# Patient Record
Sex: Female | Born: 1970 | Race: White | Hispanic: No | Smoking: Current every day smoker
Health system: Southern US, Community
[De-identification: ages and names within clinical notes are randomized; demographics above are authoritative.]

## PROBLEM LIST (undated history)

## (undated) DIAGNOSIS — I10 Essential (primary) hypertension: Secondary | ICD-10-CM

## (undated) DIAGNOSIS — I82409 Acute embolism and thrombosis of unspecified deep veins of unspecified lower extremity: Secondary | ICD-10-CM

## (undated) DIAGNOSIS — I2699 Other pulmonary embolism without acute cor pulmonale: Secondary | ICD-10-CM

## (undated) DIAGNOSIS — E785 Hyperlipidemia, unspecified: Secondary | ICD-10-CM

## (undated) HISTORY — DX: Acute embolism and thrombosis of unspecified deep veins of unspecified lower extremity: I82.409

## (undated) HISTORY — DX: Essential (primary) hypertension: I10

## (undated) HISTORY — DX: Hyperlipidemia, unspecified: E78.5

## (undated) HISTORY — PX: BUNIONECTOMY: SHX129

## (undated) HISTORY — DX: Other pulmonary embolism without acute cor pulmonale: I26.99

---

## 1996-09-01 DIAGNOSIS — I82409 Acute embolism and thrombosis of unspecified deep veins of unspecified lower extremity: Secondary | ICD-10-CM

## 1996-09-01 DIAGNOSIS — I2699 Other pulmonary embolism without acute cor pulmonale: Secondary | ICD-10-CM

## 1996-09-01 HISTORY — DX: Other pulmonary embolism without acute cor pulmonale: I26.99

## 1996-09-01 HISTORY — DX: Acute embolism and thrombosis of unspecified deep veins of unspecified lower extremity: I82.409

## 2013-02-05 DIAGNOSIS — M659 Synovitis and tenosynovitis, unspecified: Secondary | ICD-10-CM | POA: Insufficient documentation

## 2013-02-05 DIAGNOSIS — M545 Low back pain, unspecified: Secondary | ICD-10-CM | POA: Insufficient documentation

## 2013-02-05 DIAGNOSIS — K219 Gastro-esophageal reflux disease without esophagitis: Secondary | ICD-10-CM | POA: Insufficient documentation

## 2013-02-05 DIAGNOSIS — R29898 Other symptoms and signs involving the musculoskeletal system: Secondary | ICD-10-CM | POA: Insufficient documentation

## 2013-02-05 DIAGNOSIS — F5105 Insomnia due to other mental disorder: Secondary | ICD-10-CM | POA: Insufficient documentation

## 2013-02-05 DIAGNOSIS — M542 Cervicalgia: Secondary | ICD-10-CM | POA: Insufficient documentation

## 2013-05-13 DIAGNOSIS — R32 Unspecified urinary incontinence: Secondary | ICD-10-CM | POA: Insufficient documentation

## 2014-12-06 ENCOUNTER — Ambulatory Visit (HOSPITAL_COMMUNITY): Payer: 59 | Attending: Cardiology | Admitting: Cardiology

## 2014-12-06 ENCOUNTER — Other Ambulatory Visit (HOSPITAL_COMMUNITY): Payer: Self-pay | Admitting: *Deleted

## 2014-12-06 DIAGNOSIS — M79605 Pain in left leg: Secondary | ICD-10-CM | POA: Diagnosis not present

## 2014-12-06 DIAGNOSIS — M7989 Other specified soft tissue disorders: Secondary | ICD-10-CM | POA: Insufficient documentation

## 2014-12-06 NOTE — Progress Notes (Signed)
Left lower venous duplex performed  

## 2016-03-12 ENCOUNTER — Other Ambulatory Visit (HOSPITAL_COMMUNITY): Payer: Self-pay | Admitting: General Surgery

## 2016-03-26 ENCOUNTER — Ambulatory Visit (HOSPITAL_COMMUNITY): Payer: 59

## 2016-03-26 ENCOUNTER — Ambulatory Visit (HOSPITAL_COMMUNITY): Admission: RE | Admit: 2016-03-26 | Payer: 59 | Source: Ambulatory Visit

## 2016-04-01 ENCOUNTER — Ambulatory Visit: Payer: 59 | Admitting: Skilled Nursing Facility1

## 2017-01-09 ENCOUNTER — Ambulatory Visit (INDEPENDENT_AMBULATORY_CARE_PROVIDER_SITE_OTHER): Payer: 59 | Admitting: Podiatry

## 2017-01-09 VITALS — BP 143/94 | HR 104

## 2017-01-09 DIAGNOSIS — L600 Ingrowing nail: Secondary | ICD-10-CM

## 2017-01-09 NOTE — Progress Notes (Signed)
   Subjective:    Patient ID: Rita Cunningham, female    DOB: 10-Jun-1971, 46 y.o.   MRN: [id number]  HPI    Review of Systems  Endocrine:       Increased urination  Hematological: Bruises/bleeds easily.       Objective:   Physical Exam        Assessment & Plan:

## 2017-01-11 NOTE — Progress Notes (Signed)
Subjective:    Patient ID: Rita LeitzKelly Demont, female   DOB: 46 y.o.   MRN: [id number]   HPI patient presents with chronic damage and pain to the big toenails of both feet that have been very painful for a long time and she's tried to trim them tried to soak them tried medication without relief of symptoms and it's been going on for years    Review of Systems  All other systems reviewed and are negative.       Objective:  Physical Exam  Cardiovascular: Intact distal pulses.   Musculoskeletal: Normal range of motion.  Neurological: She is alert.  Skin: Skin is warm and dry.  Nursing note and vitals reviewed.  neurovascular status found to be intact muscle strength adequate range of motion within normal limits with patient found to have very thickened incurvated painful hallux nails of both feet that have multiple signs of long-term damage and trauma     Assessment:    Chronic nail disease hallux bilateral that are very painful when pressed with long-term disability     Plan:   H&P with condition reviewed. I do think removal of the nails as indicated and patient wants this done and at this point I explained procedure and risk. Patient is scheduled to have this done in the next several weeks

## 2017-01-23 ENCOUNTER — Ambulatory Visit (INDEPENDENT_AMBULATORY_CARE_PROVIDER_SITE_OTHER): Payer: 59 | Admitting: Podiatry

## 2017-01-23 DIAGNOSIS — L600 Ingrowing nail: Secondary | ICD-10-CM | POA: Diagnosis not present

## 2017-01-23 MED ORDER — OXYCODONE-ACETAMINOPHEN 10-325 MG PO TABS: 1.0000 | ORAL_TABLET | Freq: Three times a day (TID) | ORAL | 0 refills | Status: DC | PRN

## 2017-01-23 NOTE — Patient Instructions (Addendum)

## 2017-01-24 NOTE — Progress Notes (Signed)
Subjective:    Patient ID: Rita Cunningham, female   DOB: 46 y.o.   MRN: [id number]   HPI patient presents with severe chronic nail disease hallux bilateral that she's wanted to remove and hit scheduled for several weeks ago. States they're very sore    ROS      Objective:  Physical Exam Neurovascular status intact muscle strength adequate with severe incurvation of nail bed hallux bilateral that are very painful when pressed and makes wearing shoe gear difficult and they have been there for years    Assessment:    Chronic nail disease hallux bilateral with pain     Plan:    H&P condition reviewed and recommended nail removal explaining procedure risk and recovery. She wants the surgery and today I infiltrated each hallux 60 mg like Marcaine mixture remove the nails exposed matrix and applied phenol 5 applications 30 seconds followed by alcohol lavaged sterile dressing. Wrote her a prescription for Percocet just to take at night and advised on elevation soaks and will be seen back

## 2017-06-26 DIAGNOSIS — F325 Major depressive disorder, single episode, in full remission: Secondary | ICD-10-CM | POA: Insufficient documentation

## 2017-09-09 ENCOUNTER — Ambulatory Visit: Payer: 59 | Admitting: Physician Assistant

## 2017-09-18 ENCOUNTER — Ambulatory Visit: Payer: BLUE CROSS/BLUE SHIELD | Admitting: Physician Assistant

## 2017-09-18 ENCOUNTER — Encounter: Payer: Self-pay | Admitting: Physician Assistant

## 2017-09-18 VITALS — BP 142/82 | HR 88 | Ht 66.0 in | Wt 190.0 lb

## 2017-09-18 DIAGNOSIS — R41840 Attention and concentration deficit: Secondary | ICD-10-CM | POA: Diagnosis not present

## 2017-09-18 DIAGNOSIS — N3281 Overactive bladder: Secondary | ICD-10-CM | POA: Diagnosis not present

## 2017-09-18 DIAGNOSIS — I2699 Other pulmonary embolism without acute cor pulmonale: Secondary | ICD-10-CM | POA: Diagnosis not present

## 2017-09-18 DIAGNOSIS — F411 Generalized anxiety disorder: Secondary | ICD-10-CM | POA: Diagnosis not present

## 2017-09-18 DIAGNOSIS — I10 Essential (primary) hypertension: Secondary | ICD-10-CM | POA: Diagnosis not present

## 2017-09-18 DIAGNOSIS — F39 Unspecified mood [affective] disorder: Secondary | ICD-10-CM | POA: Diagnosis not present

## 2017-09-18 DIAGNOSIS — E782 Mixed hyperlipidemia: Secondary | ICD-10-CM | POA: Diagnosis not present

## 2017-09-18 DIAGNOSIS — H9191 Unspecified hearing loss, right ear: Secondary | ICD-10-CM | POA: Diagnosis not present

## 2017-09-18 NOTE — Patient Instructions (Signed)
Will make referral for urology and ADHD testing.

## 2017-09-18 NOTE — Progress Notes (Signed)
   Subjective:    Patient ID: Rita Cunningham, female    DOB: Apr 08, 1971, 47 y.o.   MRN: [id number]  HPI Pt is a 47 yo female with episodic mood disordere, HTN, OAB who presents to the clinic to establish care.   OAB-not controlled with mybetriq or vesicare. She feels like she needs more of a work up.   Her mood disorder and anxiety is managed by Dr. Sandria ManlyLove.   She is concerned today because of her focus. It has become increasingly hard for the last 4 years. She finds it the hardest at work. 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 She finds herself making mistakes and increasing her anxiety.   .. Active Ambulatory Problems    Diagnosis Date Noted  . Hyperlipidemia 09/20/2017  . Pulmonary embolism (HCC) 09/01/1996  . Hypertension 09/20/2017  . Episodic mood disorder (HCC) 09/20/2017  . GAD (generalized anxiety disorder) 09/20/2017  . Hearing loss of right ear 09/20/2017  . OAB (overactive bladder) 09/20/2017  . Inattention 09/20/2017   Resolved Ambulatory Problems    Diagnosis Date Noted  . No Resolved Ambulatory Problems   Past Medical History:  Diagnosis Date  . DVT (deep venous thrombosis) (HCC) 1998  . Hyperlipidemia   . Hypertension   . Pulmonary embolism (HCC) 1998     Review of Systems  All other systems reviewed and are negative.      Objective:   Physical Exam  Constitutional: She is oriented to person, place, and time. She appears well-developed and well-nourished.  HENT:  Head: Normocephalic and atraumatic.  Cardiovascular: Normal rate, regular rhythm and normal heart sounds.  Pulmonary/Chest: Effort normal and breath sounds normal.  Neurological: She is alert and oriented to person, place, and time.  Psychiatric: She has a normal mood and affect. Her behavior is normal.          Assessment & Plan:  Marland Kitchen.Marland Kitchen.Tresa EndoKelly was seen today  for establish care and urinary incontinence.  Diagnoses and all orders for this visit:  Inattention -     Ambulatory referral to Psychology  OAB (overactive bladder) -     Ambulatory referral to Urology  Mixed hyperlipidemia  Other pulmonary embolism without acute cor pulmonale, unspecified chronicity (HCC)  Essential hypertension  Episodic mood disorder (HCC)  GAD (generalized anxiety disorder)  Hearing loss of right ear, unspecified hearing loss type   .Marland Kitchen. [facility]  Decreased Interest 0  Down, Depressed, Hopeless 0  PHQ - 2 Score 0  Altered sleeping 0  Tired, decreased energy 1  Change in appetite 0  Feeling bad or failure about yourself  0  Trouble concentrating 1  Moving slowly or fidgety/restless 1  Suicidal thoughts 0  PHQ-9 Score 3   Per patient had fasting labs recently. Will get us a copy to scan in.   I would like patient to be evaluated for ADHD. Certainly some of her mood disorders could be making focus difficult or the fact that her job conditions make it hard to focus. Her office is working on getting her a door with her hearing loss.   OAB pt has tried mybetriq and vesicare with no symptom relief. Referral made.

## 2017-09-20 ENCOUNTER — Encounter: Payer: Self-pay | Admitting: Physician Assistant

## 2017-09-20 DIAGNOSIS — I10 Essential (primary) hypertension: Secondary | ICD-10-CM | POA: Insufficient documentation

## 2017-09-20 DIAGNOSIS — F411 Generalized anxiety disorder: Secondary | ICD-10-CM | POA: Insufficient documentation

## 2017-09-20 DIAGNOSIS — R41840 Attention and concentration deficit: Secondary | ICD-10-CM | POA: Insufficient documentation

## 2017-09-20 DIAGNOSIS — N3281 Overactive bladder: Secondary | ICD-10-CM | POA: Insufficient documentation

## 2017-09-20 DIAGNOSIS — H9191 Unspecified hearing loss, right ear: Secondary | ICD-10-CM | POA: Insufficient documentation

## 2017-09-20 DIAGNOSIS — E785 Hyperlipidemia, unspecified: Secondary | ICD-10-CM | POA: Insufficient documentation

## 2017-09-20 DIAGNOSIS — F39 Unspecified mood [affective] disorder: Secondary | ICD-10-CM | POA: Insufficient documentation

## 2017-09-25 ENCOUNTER — Encounter: Payer: Self-pay | Admitting: Physician Assistant

## 2018-07-20 DIAGNOSIS — Z0000 Encounter for general adult medical examination without abnormal findings: Secondary | ICD-10-CM | POA: Insufficient documentation

## 2019-01-27 ENCOUNTER — Ambulatory Visit: Payer: PRIVATE HEALTH INSURANCE | Admitting: Podiatry

## 2019-01-27 ENCOUNTER — Other Ambulatory Visit: Payer: Self-pay

## 2019-01-27 ENCOUNTER — Encounter: Payer: Self-pay | Admitting: Podiatry

## 2019-01-27 VITALS — Temp 98.2°F

## 2019-01-27 DIAGNOSIS — L600 Ingrowing nail: Secondary | ICD-10-CM

## 2019-01-27 MED ORDER — OXYCODONE-ACETAMINOPHEN 10-325 MG PO TABS: 1.0000 | ORAL_TABLET | Freq: Three times a day (TID) | ORAL | 0 refills | Status: AC | PRN

## 2019-01-27 MED ORDER — NEOMYCIN-POLYMYXIN-HC 3.5-10000-1 OT SOLN: OTIC | 0 refills | Status: DC

## 2019-01-27 NOTE — Progress Notes (Signed)
Subjective:   Patient ID: Rita Cunningham, female   DOB: 48 y.o.   MRN: [id number]   HPI Patient states there is been a regrowth of nail bed on the right big toe and it hurts somewhat on the lateral side and it makes it hard to wear shoe gear comfortably   ROS      Objective:  Physical Exam  Neurovascular status intact with patient found to have a dystrophic hallux nail right lateral side that is moderately painful when palpated     Assessment:  Chronic hallux nail disease right hallux with pain     Plan:  H&P condition reviewed and recommended removal of the corner.  Explained procedure risk and patient wants surgery and today I went ahead and infiltrated the right hallux 60 mg like Marcaine mixture removed the pathological nail exposed fat and applied phenol 3 applications 30 seconds followed by alcohol lavage and sterile dressing.  Gave instructions on soaks and reappoint

## 2019-01-27 NOTE — Patient Instructions (Signed)

## 2019-01-28 ENCOUNTER — Ambulatory Visit: Payer: 59 | Admitting: Podiatry

## 2020-01-11 ENCOUNTER — Other Ambulatory Visit: Payer: Self-pay | Admitting: *Deleted

## 2020-01-11 DIAGNOSIS — I83893 Varicose veins of bilateral lower extremities with other complications: Secondary | ICD-10-CM

## 2020-01-16 ENCOUNTER — Other Ambulatory Visit: Payer: Self-pay

## 2020-01-16 ENCOUNTER — Ambulatory Visit (HOSPITAL_COMMUNITY)
Admission: RE | Admit: 2020-01-16 | Discharge: 2020-01-16 | Disposition: A | Discharge status: Home / Self Care | Payer: PRIVATE HEALTH INSURANCE | Source: Ambulatory Visit | Attending: Surgery | Admitting: Surgery

## 2020-01-16 DIAGNOSIS — I83893 Varicose veins of bilateral lower extremities with other complications: Secondary | ICD-10-CM

## 2020-01-18 ENCOUNTER — Ambulatory Visit: Payer: PRIVATE HEALTH INSURANCE | Admitting: Vascular Surgery

## 2020-01-18 ENCOUNTER — Encounter: Payer: Self-pay | Admitting: Vascular Surgery

## 2020-01-18 ENCOUNTER — Other Ambulatory Visit: Payer: Self-pay

## 2020-01-18 VITALS — BP 119/60 | HR 114 | Temp 98.0°F | Resp 18 | Ht 66.0 in | Wt 206.9 lb

## 2020-01-18 DIAGNOSIS — I83813 Varicose veins of bilateral lower extremities with pain: Secondary | ICD-10-CM

## 2020-01-18 NOTE — Progress Notes (Signed)
Patient name: Rita Cunningham MRN: [id number] DOB: 12/13/1970 Sex: female  HPI: [name] is a 49 y.o. female, presents with symptomatic varicose veins with pain and swelling.  She has a very large varicosity down the lateral aspect of her right leg which becomes inflamed red and swollen at times.  She has had a prior DVT and pulmonary embolus in the remote past.  She has no family history of varicose veins.  She also has some varicosities on the inner aspect of both ankles.  The left leg occasionally hurts but it is not as bad as the right.  She states she has worn compression stockings for the last 10 years but they have never really given her relief.  She has not had any prior wounds on her legs.  Other medical problems include hyperlipidemia hypertension.  She does currently smoke.  Past Medical History:  Diagnosis Date  . DVT (deep venous thrombosis) ([place]) 1998  . Hyperlipidemia   . Hypertension   . Pulmonary embolism ([place]) 1998   Past Surgical History:  Procedure Laterality Date  . BUNIONECTOMY    . CESAREAN SECTION      Family History  Problem Relation Age of Onset  . Stroke Mother   . Stroke Father   . Heart attack Maternal Grandmother   . Heart attack Maternal Grandfather   . Birth defects Paternal Grandmother     SOCIAL HISTORY: Social History   Socioeconomic History  . Marital status: Legally Separated    Spouse name: Not on file  . Number of children: Not on file  . Years of education: Not on file  . Highest education level: Not on file  Occupational History  . Not on file  Tobacco Use  . Smoking status: Current Every Day Smoker  . Smokeless tobacco: Never Used  Substance and Sexual Activity  . Alcohol use: Yes  . Drug use: No  . Sexual activity: Not Currently  Other Topics Concern  . Not on file  Social History Narrative  . Not on file   Social Determinants of Health   Financial Resource Strain:   . Difficulty of Paying Living Expenses:   Food  Insecurity:   . Worried About Charity fundraiser in the Last Year:   . Arboriculturist in the Last Year:   Transportation Needs:   . Film/video editor (Medical):   Marland Kitchen Lack of Transportation (Non-Medical):   Physical Activity:   . Days of Exercise per Week:   . Minutes of Exercise per Session:   Stress:   . Feeling of Stress :   Social Connections:   . Frequency of Communication with Friends and Family:   . Frequency of Social Gatherings with Friends and Family:   . Attends Religious Services:   . Active Member of Clubs or Organizations:   . Attends Archivist Meetings:   Marland Kitchen Marital Status:   Intimate Partner Violence:   . Fear of Current or Ex-Partner:   . Emotionally Abused:   Marland Kitchen Physically Abused:   . Sexually Abused:     Allergies  Allergen Reactions  . Morphine Other (See Comments)    Current Outpatient Medications  Medication Sig Dispense Refill  . aspirin 325 MG tablet Take 325 mg by mouth daily.    . clonazePAM (KLONOPIN) 1 MG tablet Take 0.5 tablets by mouth at bedtime.    Marland Kitchen escitalopram (LEXAPRO) 20 MG tablet Take by mouth.    . Ibuprofen-Famotidine (DUEXIS)  800-26.6 MG TABS Take by mouth.    . lamoTRIgine (LAMICTAL) 200 MG tablet Take 1 to 2 tabs a day    . LOSARTAN POTASSIUM PO Take 25 mg by mouth daily.     . Melatonin 3 MG TABS Take 6 mg by mouth.    . neomycin-polymyxin-hydrocortisone (CORTISPORIN) OTIC solution Apply 1-2 drops to toe after soaking twice a day 10 mL 0  . Olopatadine HCl 0.2 % SOLN APPLY 1 DROP INTO BOTH EYES ONCE A DAY AS NEEDED    . simvastatin (ZOCOR) 20 MG tablet Take by mouth.    . TURMERIC PO Take by mouth.     No current facility-administered medications for this visit.    ROS:   General:  No weight loss, Fever, chills  HEENT: No recent headaches, no nasal bleeding, no visual changes, no sore throat  Neurologic: No dizziness, blackouts, seizures. No recent symptoms of stroke or mini- stroke. No recent episodes of  slurred speech, or temporary blindness.  Cardiac: No recent episodes of chest pain/pressure, no shortness of breath at rest.  No shortness of breath with exertion.  Denies history of atrial fibrillation or irregular heartbeat  Vascular: No history of rest pain in feet.  No history of claudication.  No history of non-healing ulcer, No history of DVT   Pulmonary: No home oxygen, no productive cough, no hemoptysis,  No asthma or wheezing  Musculoskeletal:  [ ]  Arthritis, [ ]  Low back pain,  [ ]  Joint pain  Hematologic:No history of hypercoagulable state.  No history of easy bleeding.  No history of anemia  Gastrointestinal: No hematochezia or melena,  No gastroesophageal reflux, no trouble swallowing  Urinary: [ ]  chronic Kidney disease, [ ]  on HD - [ ]  MWF or [ ]  TTHS, [ ]  Burning with urination, [ ]  Frequent urination, [ ]  Difficulty urinating;   Skin: No rashes  Psychological: No history of anxiety,  No history of depression   Physical Examination  There were no vitals filed for this visit.  There is no height or weight on file to calculate BMI.  General:  Alert and oriented, no acute distress HEENT: Normal Neck: No JVD Cardiac: Regular Rate and Rhythm Skin: No rash, 4 mm tortuous varicosity running down the lateral aspect of her right thigh to the knee.  Smaller 3 mm varicosities in the medial ankle bilaterally. Extremity Pulses:  2+ dorsalis pedis pulses bilaterally Musculoskeletal: No deformity or edema  Neurologic: Upper and lower extremity motor 5/5 and symmetric  DATA:  Patient had a venous reflux exam 2 days ago.  I reviewed and interpreted the study.  Right leg showed deep vein reflux.  She also had reflux in the right greater saphenous vein with a vein diameter of 5 mm throughout most of its course.  She also had a accessory saphenous with vein diameter of 4 to 5 mm with reflux.  The anterior accessory communicated with most of the varicosities on the lateral thigh.  On  the left side she had no deep vein reflux.  She did have reflux in the left greater saphenous vein with a 4 to 5 mm diameter.  Lesser saphenous vein had no reflux.  I did a SonoSite exam at the bedside which confirmed that the right greater saphenous is about 4 mm in diameter.  The left was about 3-1/2 to 4 mm in diameter.  I was unable to demonstrate a clear communication with the anterior accessory vein on the right leg.  ASSESSMENT: Patient with symptomatic varicose veins with pain and swelling.  Right leg worse than the left.   PLAN: Symptomatic varicose veins.  Pathophysiology of veins was discussed with patient today.  We will start out with conservative measures of continued compression stockings thigh-Kurt 20 to 30 mmHg she was measured and fitted for these today.  She will also be leg elevation and continue with her anti-inflammatories that she takes for other indications.  She will follow up with [place] in 3 months time for consideration of laser ablation if her symptoms or not improved and relieved.   Fabienne Bruns, MD [facility] [place] Office: [phone] Pager: [phone]

## 2020-02-02 ENCOUNTER — Encounter: Payer: BLUE CROSS/BLUE SHIELD | Admitting: Vascular Surgery

## 2020-02-02 ENCOUNTER — Encounter (HOSPITAL_COMMUNITY): Payer: BLUE CROSS/BLUE SHIELD

## 2020-02-21 DIAGNOSIS — E559 Vitamin D deficiency, unspecified: Secondary | ICD-10-CM | POA: Insufficient documentation

## 2020-04-25 ENCOUNTER — Ambulatory Visit: Payer: PRIVATE HEALTH INSURANCE | Admitting: Vascular Surgery

## 2020-04-25 ENCOUNTER — Other Ambulatory Visit: Payer: Self-pay

## 2020-04-25 ENCOUNTER — Encounter: Payer: Self-pay | Admitting: Vascular Surgery

## 2020-04-25 VITALS — BP 119/79 | HR 96 | Temp 97.9°F | Resp 14

## 2020-04-25 DIAGNOSIS — I83813 Varicose veins of bilateral lower extremities with pain: Secondary | ICD-10-CM | POA: Diagnosis not present

## 2020-04-25 NOTE — Progress Notes (Signed)
Patient is a 49 year old female who returns for follow-up today.  She was last seen May 2021.  At that time she had primarily right leg varicose veins which were symptomatic with pain and swelling especially over the right lateral thigh.  She also has a cluster of varicosities in her left medial malleolus that sometimes causes some pain and swelling.  She has not really had complete relief of her symptoms from her compression stockings.  Review of systems: She has no shortness of breath.  She has no chest pain.  She did have a prior history of a DVT but is no longer on anticoagulation.  Physical exam:  Vitals:   04/25/20 1408  BP: 119/79  Pulse: 96  Resp: 14  Temp: 97.9 F (36.6 C)  TempSrc: Temporal  SpO2: 96%    Extremities: Right lateral thigh tortuous varicosities covering a surface area of about 12 cm, left medial malleolus cluster of varicosities covering a surface area of about 5 cm veins about 4 to 5 mm in diameter on both sides.  Data: I performed a SonoSite ultrasound at the bedside today which confirmed that the patient's right greater saphenous vein is 5 mm in diameter at the knee and fairly uniform all the way up to the saphenofemoral junction.  Assessment: Symptomatic varicose veins right leg with some mild symptoms in the left leg.  At this point patient wishes to consider laser ablation.  Plan: Right greater saphenous laser ablation with stab avulsions greater than 20 in the near future pending insurance approval.  Fabienne Bruns, MD [facility] [place] Office: [phone]

## 2020-06-05 ENCOUNTER — Other Ambulatory Visit: Payer: Self-pay | Admitting: *Deleted

## 2020-06-05 DIAGNOSIS — I83811 Varicose veins of right lower extremities with pain: Secondary | ICD-10-CM

## 2020-06-19 ENCOUNTER — Other Ambulatory Visit: Payer: Self-pay

## 2020-06-19 DIAGNOSIS — I83811 Varicose veins of right lower extremities with pain: Secondary | ICD-10-CM

## 2020-06-27 ENCOUNTER — Encounter: Payer: Self-pay | Admitting: Vascular Surgery

## 2020-06-27 ENCOUNTER — Ambulatory Visit: Payer: PRIVATE HEALTH INSURANCE | Admitting: Vascular Surgery

## 2020-06-27 ENCOUNTER — Other Ambulatory Visit: Payer: Self-pay

## 2020-06-27 VITALS — BP 144/97 | HR 90 | Temp 98.2°F | Resp 16 | Ht 66.0 in | Wt 210.0 lb

## 2020-06-27 DIAGNOSIS — I83811 Varicose veins of right lower extremities with pain: Secondary | ICD-10-CM | POA: Diagnosis not present

## 2020-06-27 HISTORY — PX: OTHER SURGICAL HISTORY: SHX169

## 2020-06-27 NOTE — Progress Notes (Signed)
     Laser Ablation Procedure    Date: 06/27/2020   Rita Cunningham DOB:05-05-1971  Consent signed: Yes      Surgeon: Fabienne Bruns  MD  Procedure: Laser Ablation: right Greater Saphenous Vein  BP (!) 144/97 (BP Location: Right Arm, Patient Position: Sitting, Cuff Size: Large)   Pulse 90   Temp 98.2 F (36.8 C) (Temporal)   Resp 16   Ht 5\' 6"  (1.676 m)   Wt 210 lb (95.3 kg)   SpO2 98%   BMI 33.89 kg/m   Tumescent Anesthesia: 600 cc 0.9% NaCl with 50 cc Lidocaine HCL 1%  and 15 cc 8.4% NaHCO3  Local Anesthesia: 17 cc Lidocaine HCL and NaHCO3 (ratio 2:1)  7 watts continuous mode     Total energy: 1688 JOULES    Total time: 241 SEC Treatment Length 28 CM  Laser Fiber Ref. #                              Lot #  [id number]   Stab Phlebectomy: > 20 Sites: Thigh and Calf  Patient tolerated procedure well  Notes: Patient wore face mask.  All staff members wore facial masks and facial shields/goggles.    Description of Procedure:  After marking the course of the secondary varicosities, the patient was placed on the operating table in the supine position, and the right leg was prepped and draped in sterile fashion.   Local anesthetic was administered and under ultrasound guidance the saphenous vein was accessed with a micro needle and guide wire; then the mirco puncture sheath was placed.  A guide wire was inserted saphenofemoral junction , followed by a 5 french sheath.  The position of the sheath and then the laser fiber below the junction was confirmed using the ultrasound.  Tumescent anesthesia was administered along the course of the saphenous vein using ultrasound guidance. The patient was placed in Trendelenburg position and protective laser glasses were placed on patient and staff, and the laser was fired at 7 watts continuous mode for a total of 1688 joules.   For stab phlebectomies, local anesthetic was administered at the previously marked varicosities, and tumescent  anesthesia was administered around the vessels.  Greater than 20 stab wounds were made using the tip of an 11 blade. And using the vein hook, the phlebectomies were performed using a hemostat to avulse the varicosities.  Adequate hemostasis was achieved.     Steri strips were applied to the stab wounds and ABD pads and thigh Sparkman compression stockings were applied.  Ace wrap bandages were applied over the phlebectomy sites and at the top of the saphenofemoral junction. Blood loss was less than 15 cc.  Discharge instructions reviewed with patient and hardcopy of discharge instructions given to patient to take home. The patient ambulated out of the operating room having tolerated the procedure well.  [id number], MD [facility] [place] Office: [phone]

## 2020-07-04 ENCOUNTER — Other Ambulatory Visit: Payer: Self-pay

## 2020-07-04 ENCOUNTER — Ambulatory Visit (HOSPITAL_COMMUNITY)
Admission: RE | Admit: 2020-07-04 | Discharge: 2020-07-04 | Disposition: A | Discharge status: Home / Self Care | Payer: PRIVATE HEALTH INSURANCE | Source: Ambulatory Visit | Attending: Vascular Surgery | Admitting: Vascular Surgery

## 2020-07-04 ENCOUNTER — Encounter: Payer: Self-pay | Admitting: Vascular Surgery

## 2020-07-04 ENCOUNTER — Ambulatory Visit (INDEPENDENT_AMBULATORY_CARE_PROVIDER_SITE_OTHER): Payer: PRIVATE HEALTH INSURANCE | Admitting: Vascular Surgery

## 2020-07-04 VITALS — BP 124/81 | HR 104 | Temp 98.1°F | Resp 16 | Ht 60.6 in

## 2020-07-04 DIAGNOSIS — I83811 Varicose veins of right lower extremities with pain: Secondary | ICD-10-CM

## 2020-07-04 NOTE — Progress Notes (Signed)
Patient is a 49 year old female who returns for postoperative follow-up today.  Patient underwent right greater saphenous vein laser ablation greater than 20 stabs on June 27, 2020.  Physical exam:  Vitals:   07/04/20 1046  BP: 124/81  Pulse: (!) 104  Resp: 16  Temp: 98.1 F (36.7 C)  TempSrc: Temporal  SpO2: 97%  Height: 5' 0.6" (1.539 m)    Extremities: Healing stab sites no erythema no discharge mild ecchymosis right inner thigh  Data: Patient had a duplex ultrasound today which shows successful ablation of the right greater saphenous vein within 7 mm of the saphenofemoral junction no DVT.  Assessment: Doing well status post laser ablation stab avulsions right greater saphenous vein and tributaries.  Plan: Patient will continue to wear the compression stockings.  She will follow up on an as-needed basis.  Fabienne Bruns, MD [facility] [place] Office: [phone]

## 2020-07-13 ENCOUNTER — Other Ambulatory Visit: Payer: Self-pay

## 2020-07-13 ENCOUNTER — Ambulatory Visit (HOSPITAL_COMMUNITY)
Admission: RE | Admit: 2020-07-13 | Discharge: 2020-07-13 | Disposition: A | Discharge status: Home / Self Care | Payer: PRIVATE HEALTH INSURANCE | Source: Ambulatory Visit | Attending: Vascular Surgery | Admitting: Vascular Surgery

## 2020-07-13 ENCOUNTER — Other Ambulatory Visit (HOSPITAL_COMMUNITY): Payer: Self-pay | Admitting: Vascular Surgery

## 2020-07-13 DIAGNOSIS — M79604 Pain in right leg: Secondary | ICD-10-CM

## 2020-07-13 DIAGNOSIS — R609 Edema, unspecified: Secondary | ICD-10-CM

## 2020-07-31 ENCOUNTER — Encounter: Payer: Self-pay | Admitting: Vascular Surgery

## 2021-10-11 ENCOUNTER — Encounter: Payer: Self-pay | Admitting: Cardiology

## 2021-10-11 ENCOUNTER — Other Ambulatory Visit: Payer: Self-pay

## 2021-10-11 ENCOUNTER — Ambulatory Visit: Payer: No Typology Code available for payment source | Admitting: Cardiology

## 2021-10-11 VITALS — BP 149/94 | HR 114 | Temp 98.6°F | Resp 17 | Ht 66.0 in | Wt 223.2 lb

## 2021-10-11 DIAGNOSIS — R739 Hyperglycemia, unspecified: Secondary | ICD-10-CM

## 2021-10-11 DIAGNOSIS — R0609 Other forms of dyspnea: Secondary | ICD-10-CM

## 2021-10-11 DIAGNOSIS — I10 Essential (primary) hypertension: Secondary | ICD-10-CM

## 2021-10-11 DIAGNOSIS — Z720 Tobacco use: Secondary | ICD-10-CM

## 2021-10-11 DIAGNOSIS — E7800 Pure hypercholesterolemia, unspecified: Secondary | ICD-10-CM

## 2021-10-11 DIAGNOSIS — R002 Palpitations: Secondary | ICD-10-CM

## 2021-10-11 MED ORDER — VERAPAMIL HCL ER 240 MG PO TBCR: 240.0000 mg | EXTENDED_RELEASE_TABLET | Freq: Every day | ORAL | 2 refills | Status: DC

## 2021-10-11 NOTE — Telephone Encounter (Signed)
From patient.

## 2021-10-11 NOTE — Progress Notes (Addendum)
Primary Physician/Referring:  Magdalene Molly, Inda Merlin, NP  Patient ID: Rita Cunningham, female    DOB: [birth year], 51 y.o.   MRN: [id number]  Chief Complaint  Patient presents with   New Patient (Initial Visit)   Hypertension   Tachycardia   Family history of heart disease   HPI:    Rita Cunningham  is a 51 y.o. Caucasian female patient with strong family history of vascular disease, mother had strokes at age 61, father had coronary disease and CABG at age 22, her brother 34 years older than her has had coronary stents at age 63, hypertension, hyperlipidemia, tobacco use disorder, moderate obesity referred to me for evaluation of hypertension and dyspnea on exertion.  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  She is presently on aspirin 325 mg daily.  Except for chronic mild exertional dyspnea she has no other complaints and feels that her heart rate is up and blood pressure is uncontrolled.  She is concerned about her vascular risk factors and wanted to establish care.  Past Medical History:  Diagnosis Date   DVT (deep venous thrombosis) ([place]) 1998   Hyperlipidemia    Hypertension    Pulmonary embolism ([place]) 1998   Past Surgical History:  Procedure Laterality Date   BUNIONECTOMY     CESAREAN SECTION     ENDOVENOUS LASER ABLATION OF RIGHT GSV AND STAB PHLEBECTOMIES OF RIGHT LEG Right 06/27/2020   Family History  Problem Relation Age of Onset   Stroke Mother 41   COPD Mother 63   Heart disease Father 65   Drug abuse Sister    Heart attack Brother 88   Heart attack Maternal Grandmother    Heart attack Maternal Grandfather    Birth defects Paternal Grandmother     Social History   Tobacco Use   Smoking status: Every Day    Packs/day: 1.00    Years: 30.00    Pack years: 30.00    Types: Cigarettes   Smokeless  tobacco: Never  Substance Use Topics   Alcohol use: Yes    Comment: occ   Marital Status: Divorced  ROS  Review of Systems  Cardiovascular:  Positive for dyspnea on exertion and palpitations. Negative for chest pain and leg swelling.  Objective  Blood pressure (!) 149/94, pulse (!) 114, temperature 98.6 F (37 C), temperature source Temporal, resp. rate 17, height '5\' 6"'  (1.676 m), weight 223 lb 3.2 oz (101.2 kg), SpO2 98 %. Body mass index is 36.03 kg/m.  Vitals with BMI 10/11/2021 07/04/2020 06/27/2020  Height '5\' 6"'  5' 0.6" '5\' 6"'   Weight 223 lbs 3 oz - 210 lbs  BMI 55.73 - 22.02  Systolic 542 706 237  Diastolic 94 81 97  Pulse 628 104 90    Physical Exam Constitutional:      Appearance: She is morbidly obese.  Neck:     Vascular: No carotid bruit or JVD.  Cardiovascular:     Rate and Rhythm: Normal rate and regular rhythm.     Pulses: Intact distal pulses.     Heart sounds: Normal heart sounds. No murmur heard.   No gallop.  Pulmonary:     Effort: Pulmonary effort is normal.     Breath sounds: Normal breath sounds.  Abdominal:     General: Bowel sounds are normal.  Palpations: Abdomen is soft.  Musculoskeletal:     Right lower leg: No edema.     Left lower leg: No edema.     Medications and allergies   Allergies  Allergen Reactions   Morphine Other (See Comments)    Severe cranial pain   Tape Rash     Medication list after today's encounter    Current Outpatient Medications:    aspirin 325 MG tablet, Take 325 mg by mouth daily., Disp: , Rfl:    buPROPion (WELLBUTRIN XL) 300 MG 24 hr tablet, Take 1 tablet by mouth daily., Disp: , Rfl:    clonazePAM (KLONOPIN) 1 MG tablet, Take 0.5 tablets by mouth at bedtime., Disp: , Rfl:    escitalopram (LEXAPRO) 20 MG tablet, Take by mouth., Disp: , Rfl:    Ibuprofen-Famotidine 800-26.6 MG TABS, Take by mouth., Disp: , Rfl:    lamoTRIgine (LAMICTAL) 200 MG tablet, Take 1 to 2 tabs a day, Disp: , Rfl:     losartan-hydrochlorothiazide (HYZAAR) 50-12.5 MG tablet, Take 1 tablet by mouth daily., Disp: , Rfl:    magnesium oxide (MAG-OX) 400 MG tablet, Take 1 tablet by mouth daily., Disp: , Rfl:    Melatonin 3 MG TABS, Take 6 mg by mouth., Disp: , Rfl:    Olopatadine HCl 0.2 % SOLN, APPLY 1 DROP INTO BOTH EYES ONCE A DAY AS NEEDED, Disp: , Rfl:    oxybutynin (DITROPAN) 5 MG tablet, Take 5 mg by mouth daily., Disp: , Rfl:    pantoprazole (PROTONIX) 20 MG tablet, Take 1 tablet by mouth daily., Disp: , Rfl:    simvastatin (ZOCOR) 40 MG tablet, Take 40 mg by mouth at bedtime., Disp: , Rfl:    verapamil (CALAN-SR) 240 MG CR tablet, Take 1 tablet (240 mg total) by mouth at bedtime., Disp: 30 tablet, Rfl: 2  .med Laboratory examination:   No results for input(s): NA, K, CL, CO2, GLUCOSE, BUN, CREATININE, CALCIUM, GFRNONAA, GFRAA in the last 8760 hours. CrCl cannot be calculated (No successful lab value found.).  No flowsheet data found. No flowsheet data found.  Lipid Panel No results for input(s): CHOL, TRIG, LDLCALC, VLDL, HDL, CHOLHDL, LDLDIRECT in the last 8760 hours. Lipid Panel  No results found for: CHOL, TRIG, HDL, CHOLHDL, VLDL, LDLCALC, LDLDIRECT, LABVLDL   HEMOGLOBIN A1C No results found for: HGBA1C, MPG TSH No results for input(s): TSH in the last 8760 hours.  External labs:   Labs 09/27/2020:  Hb 14.2/HCT 41.9, platelets 208, normal indicis.  Sodium 139, potassium 3.8, BUN 21, creatinine 0.83, EGFR >60 mL.  Serum glucose 161.  Labs 08/03/2019:  Total cholesterol 216, triglycerides 116, HDL 43, LDL 140.  TSH normal.  Current [facility] will need  Radiology:     Cardiac Studies:   No results found for this or any previous visit from the past 1095 days.     No results found for this or any previous visit from the past 1095 days.   NA  EKG:   EKG 10/11/2021: Sinus tachycardia at rate of 109 beats a minute, otherwise normal EKG.     Assessment     ICD-10-CM   1.  Primary hypertension  I10 EKG 12-Lead    verapamil (CALAN-SR) 240 MG CR tablet    TSH    CMP14+EGFR    CBC    2. Dyspnea on exertion  R06.09 CT CARDIAC SCORING ([facility] LOCATIONS ONLY)    PCV ECHOCARDIOGRAM COMPLETE    3. Palpitation  R00.2  4. Pure hypercholesterolemia  E78.00 CT CARDIAC SCORING ([facility] LOCATIONS ONLY)    Lipid Panel With LDL/HDL Ratio    Lipoprotein A (LPA)    Apo A1 + B + Ratio    5. Tobacco use  Z72.0     6. Hyperglycemia  R73.9 Hgb A1c w/o eAG       Medications Discontinued During This Encounter  Medication Reason   escitalopram (LEXAPRO) 20 MG tablet    LOSARTAN POTASSIUM PO    neomycin-polymyxin-hydrocortisone (CORTISPORIN) OTIC solution    TURMERIC PO    simvastatin (ZOCOR) 20 MG tablet Change in therapy    Meds ordered this encounter  Medications   verapamil (CALAN-SR) 240 MG CR tablet    Sig: Take 1 tablet (240 mg total) by mouth at bedtime.    Dispense:  30 tablet    Refill:  2   Orders Placed This Encounter  Procedures   CT CARDIAC SCORING ([facility] LOCATIONS ONLY)    Standing Status:   Future    Standing Expiration Date:   12/09/2021    Order Specific Question:   Is patient pregnant?    Answer:   No    Order Specific Question:   Preferred imaging location?    Answer:   [facility]   TSH   Lipid Panel With LDL/HDL Ratio   CMP14+EGFR   CBC   Lipoprotein A (LPA)   Apo A1 + B + Ratio   Hgb A1c w/o eAG   EKG 12-Lead   PCV ECHOCARDIOGRAM COMPLETE    Standing Status:   Future    Standing Expiration Date:   10/11/2022   Recommendations:   Chequita Mofield is a 51 y.o. Caucasian female patient with strong family history of vascular disease, mother had strokes at age 44, father had coronary disease and CABG at age 74, her brother 46 years older than her has had coronary stents at age 50, hypertension, hyperlipidemia, tobacco use disorder, moderate obesity referred to me for evaluation of hypertension and dyspnea on exertion.  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  She is presently on aspirin 325 mg daily.  Her symptoms of dyspnea are multifactorial and related to deconditioning, obesity, hypertension and tobacco use disorder.  Underlying CAD cannot be excluded.  I would like to restratify her further by performing coronary calcium score.  We will obtain an echocardiogram.  With regard to tachycardia, again this is a combination of obesity and also tobacco use and deconditioning.  We will start her on verapamil to 40 mg daily.  Side effects discussed.  Reviewed external labs, I ordered CMP, CBC, lipids, A1c as she had hyperglycemia, TSH.  I will also obtain LPA and ApoA1: Apo B ratios in view of strong family history of vascular disease.  I briefly discussed regarding smoking cessation.  I will see her back after this test in 6 weeks and make further recommendations.    Adrian Prows, MD, [facility] 10/11/2021, 3:43 PM Office: [phone]

## 2021-10-12 LAB — LIPID PANEL WITH LDL/HDL RATIO
Cholesterol, Total: 182 mg/dL (ref 100–199)
HDL: 54 mg/dL (ref >39)
LDL Chol Calc (NIH): 114 mg/dL — ABNORMAL HIGH (ref 0–99)
LDL/HDL Ratio: 2.1 ratio (ref 0.0–3.2)
Triglycerides: 78 mg/dL (ref 0–149)
VLDL Cholesterol Cal: 14 mg/dL (ref 5–40)

## 2021-10-12 LAB — APO A1 + B + RATIO
Apolipo. B/A-1 Ratio: 0.6 ratio (ref 0.0–0.6)
Apolipoprotein A-1: 164 mg/dL (ref 116–209)
Apolipoprotein B: 94 mg/dL — ABNORMAL HIGH (ref <90)

## 2021-10-12 LAB — CMP14+EGFR
ALT: 34 IU/L — ABNORMAL HIGH (ref 0–32)
AST: 25 IU/L (ref 0–40)
Albumin/Globulin Ratio: 2.2 (ref 1.2–2.2)
Albumin: 4.9 g/dL — ABNORMAL HIGH (ref 3.8–4.8)
Alkaline Phosphatase: 74 IU/L (ref 44–121)
BUN/Creatinine Ratio: 12 (ref 9–23)
BUN: 12 mg/dL (ref 6–24)
Bilirubin Total: 0.6 mg/dL (ref 0.0–1.2)
CO2: 23 mmol/L (ref 20–29)
Calcium: 9.8 mg/dL (ref 8.7–10.2)
Chloride: 101 mmol/L (ref 96–106)
Creatinine, Ser: 0.99 mg/dL (ref 0.57–1.00)
Globulin, Total: 2.2 g/dL (ref 1.5–4.5)
Glucose: 78 mg/dL (ref 70–99)
Potassium: 4.1 mmol/L (ref 3.5–5.2)
Sodium: 141 mmol/L (ref 134–144)
Total Protein: 7.1 g/dL (ref 6.0–8.5)
eGFR: 70 mL/min/{1.73_m2} (ref >59)

## 2021-10-12 LAB — LIPOPROTEIN A (LPA): Lipoprotein (a): 58.5 nmol/L (ref <75.0)

## 2021-10-12 LAB — CBC
Hematocrit: 47.8 % — ABNORMAL HIGH (ref 34.0–46.6)
Hemoglobin: 16.6 g/dL — ABNORMAL HIGH (ref 11.1–15.9)
MCH: 31.4 pg (ref 26.6–33.0)
MCHC: 34.7 g/dL (ref 31.5–35.7)
MCV: 91 fL (ref 79–97)
Platelets: 227 10*3/uL (ref 150–450)
RBC: 5.28 x10E6/uL (ref 3.77–5.28)
RDW: 12.4 % (ref 11.7–15.4)
WBC: 8.6 10*3/uL (ref 3.4–10.8)

## 2021-10-12 LAB — TSH: TSH: 1.41 u[IU]/mL (ref 0.450–4.500)

## 2021-10-12 LAB — HGB A1C W/O EAG: Hgb A1c MFr Bld: 5.5 % (ref 4.8–5.6)

## 2021-10-14 ENCOUNTER — Encounter: Payer: Self-pay | Admitting: Cardiology

## 2021-10-23 ENCOUNTER — Ambulatory Visit: Payer: No Typology Code available for payment source

## 2021-10-23 ENCOUNTER — Other Ambulatory Visit: Payer: Self-pay

## 2021-10-23 DIAGNOSIS — R0609 Other forms of dyspnea: Secondary | ICD-10-CM

## 2021-11-06 ENCOUNTER — Ambulatory Visit
Admission: RE | Admit: 2021-11-06 | Discharge: 2021-11-06 | Disposition: A | Discharge status: Home / Self Care | Payer: No Typology Code available for payment source | Source: Ambulatory Visit | Attending: Cardiology | Admitting: Cardiology

## 2021-11-06 DIAGNOSIS — R0609 Other forms of dyspnea: Secondary | ICD-10-CM

## 2021-11-06 DIAGNOSIS — E7800 Pure hypercholesterolemia, unspecified: Secondary | ICD-10-CM

## 2021-11-06 NOTE — Progress Notes (Signed)
Coronary calcium score 11/06/2021: ?LM 0 ?LAD 60 ?LCx 4 ?RCA 29 ?Total Agatston score 93, [place] database percentile 99. ?Ascending and descending thoracic aortic measurements are normal.  No significant extracardiac abnormality.

## 2021-11-15 ENCOUNTER — Other Ambulatory Visit: Payer: Self-pay

## 2021-11-15 ENCOUNTER — Encounter: Payer: Self-pay | Admitting: Cardiology

## 2021-11-15 ENCOUNTER — Ambulatory Visit: Payer: No Typology Code available for payment source | Admitting: Cardiology

## 2021-11-15 VITALS — BP 122/84 | HR 93 | Temp 97.6°F | Resp 16 | Ht 66.0 in | Wt 220.4 lb

## 2021-11-15 DIAGNOSIS — E7800 Pure hypercholesterolemia, unspecified: Secondary | ICD-10-CM

## 2021-11-15 DIAGNOSIS — I10 Essential (primary) hypertension: Secondary | ICD-10-CM

## 2021-11-15 DIAGNOSIS — R931 Abnormal findings on diagnostic imaging of heart and coronary circulation: Secondary | ICD-10-CM

## 2021-11-15 DIAGNOSIS — R0609 Other forms of dyspnea: Secondary | ICD-10-CM

## 2021-11-15 DIAGNOSIS — F17209 Nicotine dependence, unspecified, with unspecified nicotine-induced disorders: Secondary | ICD-10-CM

## 2021-11-15 MED ORDER — NICOTINE 14 MG/24HR TD PT24: 14.0000 mg | MEDICATED_PATCH | Freq: Every day | TRANSDERMAL | 0 refills | Status: DC

## 2021-11-15 MED ORDER — NICOTINE 7 MG/24HR TD PT24
7.0000 mg | MEDICATED_PATCH | Freq: Every day | TRANSDERMAL | 0 refills | Status: DC
Start: 2021-11-15 — End: 2022-02-17

## 2021-11-15 MED ORDER — EZETIMIBE 10 MG PO TABS: 10.0000 mg | ORAL_TABLET | Freq: Every day | ORAL | 3 refills | Status: DC

## 2021-11-15 MED ORDER — NICOTINE 21 MG/24HR TD PT24: 21.0000 mg | MEDICATED_PATCH | Freq: Every day | TRANSDERMAL | 0 refills | Status: DC

## 2021-11-15 MED ORDER — VERAPAMIL HCL ER 240 MG PO TBCR: 240.0000 mg | EXTENDED_RELEASE_TABLET | Freq: Every day | ORAL | 3 refills | Status: DC

## 2021-11-15 NOTE — Progress Notes (Signed)
? ?Primary Physician/Referring:  Magdalene Molly, Inda Merlin, NP ? ?Patient ID: Rita Cunningham, female    DOB: [birth year], 51 y.o.   MRN: [id number] ? ?Chief Complaint  ?Patient presents with  ? Hypertension  ? Hyperlipidemia  ? Shortness of Breath  ? Follow-up  ?  6 weeks  ? ?HPI:   ? ?Rita Cunningham  is a 51 y.o. Caucasian female patient with strong family history of vascular disease, mother had strokes at age 27, father had coronary disease and CABG at age 67, her brother 81 years older than her has had coronary stents at age 3, hypertension, hyperlipidemia, tobacco use disorder, moderate obesity, history of left upper extremity DVT and PE while she was on birth control pills in the past, presents to me for follow-up of hypertension, dyspnea on exertion, elevated heart rate.  On her last office visit had started her on verapamil at SR 240 mg daily, and obtain coronary calcium score and echocardiogram. ? ?Patient states that she is feeling better, dyspnea is improved, she is not having any further palpitations, blood pressure is also improved.  She has not had any chest pain. ? ?Past Medical History:  ?Diagnosis Date  ? DVT (deep venous thrombosis) ([place]) 1998  ? Hyperlipidemia   ? Hypertension   ? Pulmonary embolism ([place]) 1998  ? ?Past Surgical History:  ?Procedure Laterality Date  ? BUNIONECTOMY    ? CESAREAN SECTION    ? ENDOVENOUS LASER ABLATION OF RIGHT GSV AND STAB PHLEBECTOMIES OF RIGHT LEG Right 06/27/2020  ? ?Family History  ?Problem Relation Age of Onset  ? Stroke Mother 27  ? COPD Mother 110  ? Heart disease Father 27  ? Drug abuse Sister   ? Heart attack Brother 58  ? Heart attack Maternal Grandmother   ? Heart attack Maternal Grandfather   ? Birth defects Paternal Grandmother   ?  ?Social History  ? ?Tobacco Use  ? Smoking status: Every Day  ?  Packs/day: 1.00  ?  Years: 30.00  ?  Pack years: 30.00  ?  Types: Cigarettes  ? Smokeless tobacco: Never  ?Substance Use Topics  ? Alcohol use: Yes  ?  Comment:  occasionalyy  ? ?Marital Status: Divorced  ?ROS  ?Review of Systems  ?Cardiovascular:  Positive for dyspnea on exertion and palpitations. Negative for chest pain and leg swelling.  ?Objective  ?Blood pressure 122/84, pulse 93, temperature 97.6 ?F (36.4 ?C), temperature source Temporal, resp. rate 16, height '5\' 6"'  (1.676 m), weight 220 lb 6.4 oz (100 kg), SpO2 93 %. Body mass index is 35.57 kg/m?.  ?Vitals with BMI 11/15/2021 10/11/2021 07/04/2020  ?Height '5\' 6"'  '5\' 6"'  5' 0.6"  ?Weight 220 lbs 6 oz 223 lbs 3 oz -  ?BMI 35.59 36.04 -  ?Systolic 035 009 381  ?Diastolic 84 94 81  ?Pulse 93 114 104  ?  ?Physical Exam ?Constitutional:   ?   Appearance: She is morbidly obese.  ?Neck:  ?   Vascular: No carotid bruit or JVD.  ?Cardiovascular:  ?   Rate and Rhythm: Normal rate and regular rhythm.  ?   Pulses: Intact distal pulses.  ?   Heart sounds: Normal heart sounds. No murmur heard. ?  No gallop.  ?Pulmonary:  ?   Effort: Pulmonary effort is normal.  ?   Breath sounds: Normal breath sounds.  ?Abdominal:  ?   General: Bowel sounds are normal.  ?   Palpations: Abdomen is soft.  ?Musculoskeletal:  ?  Right lower leg: No edema.  ?   Left lower leg: No edema.  ?  ? ?Medications and allergies  ? ?Allergies  ?Allergen Reactions  ? Morphine Other (See Comments)  ?  Severe cranial pain  ? Tape Rash  ?  ? ?Medication list after today's encounter  ? ? ?Current Outpatient Medications:  ?  aspirin 325 MG tablet, Take 325 mg by mouth daily., Disp: , Rfl:  ?  buPROPion (WELLBUTRIN XL) 300 MG 24 hr tablet, Take 1 tablet by mouth daily., Disp: , Rfl:  ?  clonazePAM (KLONOPIN) 1 MG tablet, Take 0.5 tablets by mouth at bedtime., Disp: , Rfl:  ?  escitalopram (LEXAPRO) 20 MG tablet, Take by mouth., Disp: , Rfl:  ?  ezetimibe (ZETIA) 10 MG tablet, Take 1 tablet (10 mg total) by mouth daily after supper., Disp: 90 tablet, Rfl: 3 ?  lamoTRIgine (LAMICTAL) 200 MG tablet, Take 1 to 2 tabs a day, Disp: , Rfl:  ?  losartan-hydrochlorothiazide (HYZAAR)  50-12.5 MG tablet, Take 1 tablet by mouth daily., Disp: , Rfl:  ?  Melatonin 3 MG TABS, Take 6 mg by mouth., Disp: , Rfl:  ?  nicotine (NICODERM CQ) 14 mg/24hr patch, Place 1 patch (14 mg total) onto the skin daily. 21 mg dose done, Disp: 28 patch, Rfl: 0 ?  nicotine (NICODERM CQ) 21 mg/24hr patch, Place 1 patch (21 mg total) onto the skin daily., Disp: 28 patch, Rfl: 0 ?  nicotine (NICODERM CQ) 7 mg/24hr patch, Place 1 patch (7 mg total) onto the skin daily., Disp: 28 patch, Rfl: 0 ?  oxybutynin (DITROPAN) 5 MG tablet, Take 5 mg by mouth daily., Disp: , Rfl:  ?  pantoprazole (PROTONIX) 20 MG tablet, Take 1 tablet by mouth daily., Disp: , Rfl:  ?  simvastatin (ZOCOR) 40 MG tablet, Take 40 mg by mouth at bedtime., Disp: , Rfl:  ?  verapamil (CALAN-SR) 240 MG CR tablet, Take 1 tablet (240 mg total) by mouth at bedtime., Disp: 90 tablet, Rfl: 3 ? ?.med ?Laboratory examination:  ? ?Recent Labs  ?  10/11/21 ?1232  ?NA 141  ?K 4.1  ?CL 101  ?CO2 23  ?GLUCOSE 78  ?BUN 12  ?CREATININE 0.99  ?CALCIUM 9.8  ? ?CrCl cannot be calculated (Patient's most recent lab result is older than the maximum 21 days allowed.).  ?CMP Latest Ref Rng & Units 10/11/2021  ?Glucose 70 - 99 mg/dL 78  ?BUN 6 - 24 mg/dL 12  ?Creatinine 0.57 - 1.00 mg/dL 0.99  ?Sodium 134 - 144 mmol/L 141  ?Potassium 3.5 - 5.2 mmol/L 4.1  ?Chloride 96 - 106 mmol/L 101  ?CO2 20 - 29 mmol/L 23  ?Calcium 8.7 - 10.2 mg/dL 9.8  ?Total Protein 6.0 - 8.5 g/dL 7.1  ?Total Bilirubin 0.0 - 1.2 mg/dL 0.6  ?Alkaline Phos 44 - 121 IU/L 74  ?AST 0 - 40 IU/L 25  ?ALT 0 - 32 IU/L 34(H)  ? ?CBC Latest Ref Rng & Units 10/11/2021  ?WBC 3.4 - 10.8 x10E3/uL 8.6  ?Hemoglobin 11.1 - 15.9 g/dL 16.6(H)  ?Hematocrit 34.0 - 46.6 % 47.8(H)  ?Platelets 150 - 450 x10E3/uL 227  ? ? ?Lipid Panel ?Recent Labs  ?  10/11/21 ?1232  ?CHOL 182  ?TRIG 78  ?LDLCALC 114*  ?HDL 54  ? ?Labs 10/11/2021: ?Apolipoprotein A-1 116 - 209 mg/dL  164  ?Apolipoprotein B <90 mg/dL   94 [place]   ?Apolipo. B/A-1 Ratio 0.0 -  0.6 ratio  0.6 Normal (0.00 to 0.6) ? ? ?HEMOGLOBIN A1C ?Lab Results  ?Component Value Date  ? HGBA1C 5.5 10/11/2021  ? ?TSH ?Recent Labs  ?  10/11/21 ?1232  ?TSH 1.410  ? ? ?External labs:  ? ?Labs 09/27/2020: ? ?Hb 14.2/HCT 41.9, platelets 208, normal indicis. ? ?Sodium 139, potassium 3.8, BUN 21, creatinine 0.83, EGFR >60 mL.  Serum glucose 161. ? ?Labs 08/03/2019: ? ?Total cholesterol 216, triglycerides 116, HDL 43, LDL 140. ? ?TSH normal.  Current [facility] will need ? ?Radiology:  ? ? ? ?Cardiac Studies:  ? ?Coronary calcium score 11/06/2021: ?LM 0 ?LAD 60 ?LCx 4 ?RCA 29 ?Total Agatston score 93, [place] database percentile 99. ?Ascending and descending thoracic aortic measurements are normal.  No significant extracardiac abnormality. ? ?PCV ECHOCARDIOGRAM COMPLETE 10/23/2021  ?Normal LV systolic function with visual EF 60-65%. Left ventricle cavity is normal in size. Normal global wall motion. Normal diastolic filling pattern, normal LAP. ?Trace aortic regurgitation. ?No prior study for comparison. ?  ? ?EKG:  ? ?EKG 10/11/2021: Sinus tachycardia at rate of 109 beats a minute, otherwise normal EKG.    ? ?Assessment  ? ?  ICD-10-CM   ?1. Primary hypertension  I10 verapamil (CALAN-SR) 240 MG CR tablet  ?  ?2. Elevated coronary artery calcium score Agatston score 93, 99th percentile [place] data base 11/06/2021  R93.1 nicotine (NICODERM CQ) 21 mg/24hr patch  ?  nicotine (NICODERM CQ) 14 mg/24hr patch  ?  nicotine (NICODERM CQ) 7 mg/24hr patch  ?  Lipid Panel With LDL/HDL Ratio  ?  LDL cholesterol, direct  ?  LDL cholesterol, direct  ?  Lipid Panel With LDL/HDL Ratio  ?  ?3. Pure hypercholesterolemia  E78.00 ezetimibe (ZETIA) 10 MG tablet  ?  Lipid Panel With LDL/HDL Ratio  ?  LDL cholesterol, direct  ?  LDL cholesterol, direct  ?  Lipid Panel With LDL/HDL Ratio  ?  ?4. Dyspnea on exertion  R06.09   ?  ?5. Tobacco use disorder, continuous  F17.209 nicotine (NICODERM CQ) 21 mg/24hr patch  ?  nicotine (NICODERM CQ) 14 mg/24hr  patch  ?  nicotine (NICODERM CQ) 7 mg/24hr patch  ?  ?  ?CV risk 8.6 ? ?Medications Discontinued During This Encounter  ?Medication Reason  ? Ibuprofen-Famotidine 800-26.6 MG TABS   ? magnesium oxide (MAG-OX) 400 M

## 2022-02-16 NOTE — Progress Notes (Signed)
Primary Physician/Referring:  Estell Harpin, Rutha Bouchard, NP  Patient ID: Rita Cunningham, female    DOB: April 11, 1971, 51 y.o.   MRN: [id number]  Chief Complaint  Patient presents with   Follow-up    3 MONTH   Hypertension   Shortness of Breath   Hyperlipidemia   HPI:    Rita Cunningham  is a 51 y.o. Caucasian female patient with strong family history of vascular disease, mother had strokes at age 89, father had coronary disease and CABG at age 12, her brother 2 years older than her has had coronary stents at age 2, hypertension, hyperlipidemia, tobacco use disorder, moderate obesity, history of left upper extremity DVT and PE while she was on birth control pills in the past, presents to me for follow-up of hypertension, dyspnea on exertion, elevated heart rate.    Since being on verapamil, she is presently doing well, she is also reduced her smoking significantly and has also been losing weight.  She is also requesting that I take over all her blood pressure medications and cholesterol medication prescriptions.  Past Medical History:  Diagnosis Date   DVT (deep venous thrombosis) (HCC) 1998   Hyperlipidemia    Hypertension    Pulmonary embolism (HCC) 1998   Past Surgical History:  Procedure Laterality Date   BUNIONECTOMY     CESAREAN SECTION     ENDOVENOUS LASER ABLATION OF RIGHT GSV AND STAB PHLEBECTOMIES OF RIGHT LEG Right 06/27/2020   Family History  Problem Relation Age of Onset   Stroke Mother 28   COPD Mother 38   Heart disease Father 67   Drug abuse Sister    Heart attack Brother 40   Heart attack Maternal Grandmother    Heart attack Maternal Grandfather    Birth defects Paternal Grandmother     Social History   Tobacco Use   Smoking status: Every Day    Packs/day: 0.50    Years: 30.00    Total pack years: 15.00    Types: Cigarettes   Smokeless tobacco: Never  Substance Use Topics   Alcohol use: Yes    Comment: occasionalyy   Marital Status: Divorced  ROS   Review of Systems  Cardiovascular:  Positive for dyspnea on exertion. Negative for chest pain, leg swelling and palpitations.   Objective  Blood pressure 125/85, pulse 95, temperature 99.1 F (37.3 C), temperature source Temporal, resp. rate 17, height 5\' 6"  (1.676 m), weight 214 lb 3.2 oz (97.2 kg), SpO2 95 %. Body mass index is 34.57 kg/m.     02/17/2022    9:03 AM 11/15/2021    9:11 AM 10/11/2021   11:12 AM  Vitals with BMI  Height 5\' 6"  5\' 6"  5\' 6"   Weight 214 lbs 3 oz 220 lbs 6 oz 223 lbs 3 oz  BMI 34.59 35.59 36.04  Systolic 125 122 562  Diastolic 85 84 94  Pulse 95 93 114    Physical Exam Constitutional:      Appearance: She is obese.  Neck:     Vascular: No carotid bruit or JVD.  Cardiovascular:     Rate and Rhythm: Normal rate and regular rhythm.     Pulses: Intact distal pulses.     Heart sounds: Normal heart sounds. No murmur heard.    No gallop.  Pulmonary:     Effort: Pulmonary effort is normal.     Breath sounds: Normal breath sounds.  Abdominal:     General: Bowel sounds are normal.  Palpations: Abdomen is soft.  Musculoskeletal:     Right lower leg: No edema.     Left lower leg: No edema.      Medications and allergies   Allergies  Allergen Reactions   Morphine Other (See Comments)    Severe cranial pain   Tape Rash     Medication list after today's encounter    Current Outpatient Medications:    aspirin 325 MG tablet, Take 325 mg by mouth daily., Disp: , Rfl:    buPROPion (WELLBUTRIN XL) 300 MG 24 hr tablet, Take 1 tablet by mouth daily., Disp: , Rfl:    clonazePAM (KLONOPIN) 1 MG tablet, Take 0.5 tablets by mouth at bedtime., Disp: , Rfl:    escitalopram (LEXAPRO) 20 MG tablet, Take by mouth., Disp: , Rfl:    ezetimibe-simvastatin (VYTORIN) 10-40 MG tablet, Take 1 tablet by mouth daily at 6 PM., Disp: 90 tablet, Rfl: 3   lamoTRIgine (LAMICTAL) 200 MG tablet, Take 1 to 2 tabs a day, Disp: , Rfl:    Melatonin 3 MG TABS, Take 6 mg by  mouth., Disp: , Rfl:    nicotine (NICODERM CQ) 14 mg/24hr patch, Place 1 patch (14 mg total) onto the skin daily. 21 mg dose done, Disp: 28 patch, Rfl: 0   oxybutynin (DITROPAN) 5 MG tablet, Take 5 mg by mouth daily., Disp: , Rfl:    pantoprazole (PROTONIX) 20 MG tablet, Take 1 tablet by mouth daily., Disp: , Rfl:    verapamil (CALAN-SR) 240 MG CR tablet, Take 1 tablet (240 mg total) by mouth at bedtime., Disp: 90 tablet, Rfl: 3   losartan-hydrochlorothiazide (HYZAAR) 50-12.5 MG tablet, Take 1 tablet by mouth daily., Disp: 90 tablet, Rfl: 3  Laboratory examination:   Recent Labs    10/11/21 1232  NA 141  K 4.1  CL 101  CO2 23  GLUCOSE 78  BUN 12  CREATININE 0.99  CALCIUM 9.8   CrCl cannot be calculated (Patient's most recent lab result is older than the maximum 21 days allowed.).     Latest Ref Rng & Units 10/11/2021   12:32 PM  CMP  Glucose 70 - 99 mg/dL 78   BUN 6 - 24 mg/dL 12   Creatinine 1.61 - 1.00 mg/dL 0.96   Sodium 045 - 409 mmol/L 141   Potassium 3.5 - 5.2 mmol/L 4.1   Chloride 96 - 106 mmol/L 101   CO2 20 - 29 mmol/L 23   Calcium 8.7 - 10.2 mg/dL 9.8   Total Protein 6.0 - 8.5 g/dL 7.1   Total Bilirubin 0.0 - 1.2 mg/dL 0.6   Alkaline Phos 44 - 121 IU/L 74   AST 0 - 40 IU/L 25   ALT 0 - 32 IU/L 34       Latest Ref Rng & Units 10/11/2021   12:32 PM  CBC  WBC 3.4 - 10.8 x10E3/uL 8.6   Hemoglobin 11.1 - 15.9 g/dL 81.1   Hematocrit 91.4 - 46.6 % 47.8   Platelets 150 - 450 x10E3/uL 227     Lipid Panel Recent Labs    10/11/21 1232  CHOL 182  TRIG 78  LDLCALC 114*  HDL 54   Labs 10/11/2021: Apolipoprotein A-1 116 - 209 mg/dL  782  Apolipoprotein B <90 mg/dL   94 [place]   Apolipo. B/A-1 Ratio 0.0 - 0.6 ratio  0.6 Normal (0.00 to 0.6)   HEMOGLOBIN A1C Lab Results  Component Value Date   HGBA1C 5.5 10/11/2021   TSH  Recent Labs    10/11/21 1232  TSH 1.410    External labs:   Labs 09/27/2020:  Hb 14.2/HCT 41.9, platelets 208, normal  indicis.  Sodium 139, potassium 3.8, BUN 21, creatinine 0.83, EGFR >60 mL.  Serum glucose 161.  Labs 08/03/2019:  Total cholesterol 216, triglycerides 116, HDL 43, LDL 140.  TSH normal.  Current [place] will need  Radiology:     Cardiac Studies:   Coronary calcium score 11/06/2021: LM 0 LAD 60 LCx 4 RCA 29 Total Agatston score 93, [facility] database percentile 99. Ascending and descending thoracic aortic measurements are normal.  No significant extracardiac abnormality.  PCV ECHOCARDIOGRAM COMPLETE 10/23/2021  Normal LV systolic function with visual EF 60-65%. Left ventricle cavity is normal in size. Normal global wall motion. Normal diastolic filling pattern, normal LAP. Trace aortic regurgitation. No prior study for comparison.    EKG:   EKG 10/11/2021: Sinus tachycardia at rate of 109 beats a minute, otherwise normal EKG.     Assessment     ICD-10-CM   1. Elevated coronary artery calcium score Agatston score 93, 99th percentile [facility] data base 11/06/2021  R93.1 ezetimibe-simvastatin (VYTORIN) 10-40 MG tablet    2. Primary hypertension  I10 losartan-hydrochlorothiazide (HYZAAR) 50-12.5 MG tablet    3. Pure hypercholesterolemia  E78.00 ezetimibe-simvastatin (VYTORIN) 10-40 MG tablet      CV risk 8.6  Medications Discontinued During This Encounter  Medication Reason   nicotine (NICODERM CQ) 21 mg/24hr patch Patient Preference   nicotine (NICODERM CQ) 7 mg/24hr patch Patient Preference   simvastatin (ZOCOR) 40 MG tablet Change in therapy   ezetimibe (ZETIA) 10 MG tablet Change in therapy   losartan-hydrochlorothiazide (HYZAAR) 50-12.5 MG tablet Reorder     Meds ordered this encounter  Medications   ezetimibe-simvastatin (VYTORIN) 10-40 MG tablet    Sig: Take 1 tablet by mouth daily at 6 PM.    Dispense:  90 tablet    Refill:  3   losartan-hydrochlorothiazide (HYZAAR) 50-12.5 MG tablet    Sig: Take 1 tablet by mouth daily.    Dispense:  90 tablet    Refill:  3   No  orders of the defined types were placed in this encounter.  Recommendations:   Rita Cunningham is a 51 y.o. Caucasian female patient with strong family history of vascular disease, mother had strokes at age 56, father had coronary disease and CABG at age 42, her brother 2 years older than her has had coronary stents at age 4, hypertension, hyperlipidemia, tobacco use disorder, moderate obesity, history of left upper extremity DVT and PE while she was on birth control pills in the past, presents to me for follow-up of hypertension, dyspnea on exertion, elevated heart rate.    Since being on verapamil, she is presently doing well, she is also reduced her smoking significantly and has also been losing weight.  Her labs for lipids are still pending and she will obtain these in the next 1 to 2 days.  I will combine simvastatin and ezetimibe into Vytorin.  She may need to change her statins depending upon lipid profile testing.  No change in physical exam.  Dyspnea is remained stable related to obesity and smoking.  I have not performed any stress testing and she has not had any chest pain with exertion activity, but will have a low threshold to consider cardiac stress testing if she has any symptoms of worsening dyspnea or chest pain especially in view of strong family history of premature coronary  disease and underlying cardiovascular risks.  With regard to hypertension, blood pressure is well controlled.  Heart rate is also reduced from resting sinus tachycardia.  Overall stable from cardiac standpoint, I will see her back in 6 months.     She request that I take over her antihypertension medications.  Refills sent.    Rita Decamp, MD, [facility] 02/17/2022, 9:26 AM Office: [phone]

## 2022-02-17 ENCOUNTER — Ambulatory Visit: Payer: No Typology Code available for payment source | Admitting: Cardiology

## 2022-02-17 ENCOUNTER — Encounter: Payer: Self-pay | Admitting: Cardiology

## 2022-02-17 VITALS — BP 125/85 | HR 95 | Temp 99.1°F | Resp 17 | Ht 66.0 in | Wt 214.2 lb

## 2022-02-17 DIAGNOSIS — R931 Abnormal findings on diagnostic imaging of heart and coronary circulation: Secondary | ICD-10-CM

## 2022-02-17 DIAGNOSIS — I10 Essential (primary) hypertension: Secondary | ICD-10-CM

## 2022-02-17 DIAGNOSIS — E7800 Pure hypercholesterolemia, unspecified: Secondary | ICD-10-CM

## 2022-02-17 MED ORDER — LOSARTAN POTASSIUM-HCTZ 50-12.5 MG PO TABS: 1.0000 | ORAL_TABLET | Freq: Every day | ORAL | 3 refills | Status: DC

## 2022-02-17 MED ORDER — EZETIMIBE-SIMVASTATIN 10-40 MG PO TABS: 1.0000 | ORAL_TABLET | Freq: Every day | ORAL | 3 refills | Status: DC

## 2022-08-20 ENCOUNTER — Ambulatory Visit: Payer: No Typology Code available for payment source | Admitting: Cardiology

## 2022-09-03 ENCOUNTER — Ambulatory Visit: Payer: No Typology Code available for payment source | Admitting: Cardiology

## 2022-09-03 ENCOUNTER — Encounter: Payer: Self-pay | Admitting: Cardiology

## 2022-09-03 VITALS — BP 128/84 | HR 94 | Temp 97.3°F | Resp 16 | Ht 66.0 in | Wt 221.8 lb

## 2022-09-03 DIAGNOSIS — R931 Abnormal findings on diagnostic imaging of heart and coronary circulation: Secondary | ICD-10-CM

## 2022-09-03 DIAGNOSIS — E7800 Pure hypercholesterolemia, unspecified: Secondary | ICD-10-CM

## 2022-09-03 DIAGNOSIS — F17209 Nicotine dependence, unspecified, with unspecified nicotine-induced disorders: Secondary | ICD-10-CM

## 2022-09-03 DIAGNOSIS — D751 Secondary polycythemia: Secondary | ICD-10-CM

## 2022-09-03 DIAGNOSIS — I10 Essential (primary) hypertension: Secondary | ICD-10-CM

## 2022-09-03 MED ORDER — LOSARTAN POTASSIUM-HCTZ 100-12.5 MG PO TABS: 1.0000 | ORAL_TABLET | ORAL | 3 refills | Status: AC

## 2022-09-03 NOTE — Progress Notes (Signed)
Primary Physician/Referring:  Magdalene Molly, Inda Merlin, NP  Patient ID: Rita Cunningham, female    DOB: 1971-08-10, 52 y.o.   MRN: [id number]  Chief Complaint  Patient presents with   Coronary Artery Disease   Hyperlipidemia   Follow-up    6 months   HPI:    Rita Cunningham  is a 52 y.o. Caucasian female patient with strong family history of vascular disease, mother had strokes at age 81, father had coronary disease and CABG at age 61, her brother 34 years older than her has had coronary stents at age 58, hypertension, hyperlipidemia, tobacco use disorder, moderate obesity, history of left upper extremity DVT and PE while she was on birth control pills in the past, presents to me for follow-up of hypertension, dyspnea on exertion, elevated heart rate.    She is presently doing well, no further palpitations since being on verapamil, no chest pain, no dyspnea.  Tolerating all medications well and has been compliant.  Unfortunately still smoking cigarettes.  Past Medical History:  Diagnosis Date   DVT (deep venous thrombosis) ([place]) 1998   Hyperlipidemia    Hypertension    Pulmonary embolism ([place]) 1998   Past Surgical History:  Procedure Laterality Date   BUNIONECTOMY     CESAREAN SECTION     ENDOVENOUS LASER ABLATION OF RIGHT GSV AND STAB PHLEBECTOMIES OF RIGHT LEG Right 06/27/2020   Family History  Problem Relation Age of Onset   Stroke Mother 25   COPD Mother 27   Heart disease Father 82   Drug abuse Sister    Heart attack Brother 32   Heart attack Maternal Grandmother    Heart attack Maternal Grandfather    Birth defects Paternal Grandmother     Social History   Tobacco Use   Smoking status: Every Day    Packs/day: 0.50    Years: 30.00    Total pack years: 15.00    Types: Cigarettes   Smokeless tobacco: Never  Substance Use Topics   Alcohol use: Yes    Comment: occasionally   Marital Status: Divorced  ROS  Review of Systems  Cardiovascular:  Positive for dyspnea on  exertion. Negative for chest pain, leg swelling and palpitations.   Objective  Blood pressure 128/84, pulse 94, temperature (!) 97.3 F (36.3 C), temperature source Temporal, resp. rate 16, height _0  (1.676 m), weight 221 lb 12.8 oz (100.6 kg), SpO2 96 %. Body mass index is 35.8 kg/m.     09/03/2022    1:49 PM 02/17/2022    9:03 AM 11/15/2021    9:11 AM  Vitals with BMI  Height _1  _2  _3   Weight 221 lbs 13 oz 214 lbs 3 oz 220 lbs 6 oz  BMI 35.82 45.80 99.83  Systolic 382 505 397  Diastolic 84 85 84  Pulse 94 95 93    Physical Exam Constitutional:      Appearance: She is obese.  Neck:     Vascular: No carotid bruit or JVD.  Cardiovascular:     Rate and Rhythm: Normal rate and regular rhythm.     Pulses: Intact distal pulses.     Heart sounds: Normal heart sounds. No murmur heard.    No gallop.  Pulmonary:     Effort: Pulmonary effort is normal.     Breath sounds: Normal breath sounds.  Abdominal:     General: Bowel sounds are normal.     Palpations: Abdomen is soft.  Musculoskeletal:  Right lower leg: No edema.     Left lower leg: No edema.     Medications and allergies   Allergies  Allergen Reactions   Morphine Other (See Comments)    Severe cranial pain   Tape Rash    Medication list after today's encounter    Current Outpatient Medications:    aspirin 325 MG tablet, Take 325 mg by mouth daily., Disp: , Rfl:    buPROPion (WELLBUTRIN XL) 300 MG 24 hr tablet, Take 1 tablet by mouth daily., Disp: , Rfl:    clonazePAM (KLONOPIN) 1 MG tablet, Take 0.5 tablets by mouth at bedtime., Disp: , Rfl:    escitalopram (LEXAPRO) 20 MG tablet, Take by mouth., Disp: , Rfl:    ezetimibe-simvastatin (VYTORIN) 10-40 MG tablet, Take 1 tablet by mouth daily at 6 PM., Disp: 90 tablet, Rfl: 3   lamoTRIgine (LAMICTAL) 200 MG tablet, Take 1 to 2 tabs a day, Disp: , Rfl:    losartan-hydrochlorothiazide (HYZAAR) 100-12.5 MG tablet, Take 1 tablet by mouth every morning., Disp:  90 tablet, Rfl: 3   Melatonin 3 MG TABS, Take 6 mg by mouth., Disp: , Rfl:    oxybutynin (DITROPAN) 5 MG tablet, Take 5 mg by mouth daily., Disp: , Rfl:    pantoprazole (PROTONIX) 20 MG tablet, Take 1 tablet by mouth daily., Disp: , Rfl:    verapamil (CALAN-SR) 240 MG CR tablet, Take 1 tablet (240 mg total) by mouth at bedtime., Disp: 90 tablet, Rfl: 3  Laboratory examination:   Lab Results  Component Value Date   NA 141 10/11/2021   K 4.1 10/11/2021   CO2 23 10/11/2021   GLUCOSE 78 10/11/2021   BUN 12 10/11/2021   CREATININE 0.99 10/11/2021   CALCIUM 9.8 10/11/2021   EGFR 70 10/11/2021       Latest Ref Rng & Units 10/11/2021   12:32 PM  CMP  Glucose 70 - 99 mg/dL 78   BUN 6 - 24 mg/dL 12   Creatinine 0.57 - 1.00 mg/dL 0.99   Sodium 134 - 144 mmol/L 141   Potassium 3.5 - 5.2 mmol/L 4.1   Chloride 96 - 106 mmol/L 101   CO2 20 - 29 mmol/L 23   Calcium 8.7 - 10.2 mg/dL 9.8   Total Protein 6.0 - 8.5 g/dL 7.1   Total Bilirubin 0.0 - 1.2 mg/dL 0.6   Alkaline Phos 44 - 121 IU/L 74   AST 0 - 40 IU/L 25   ALT 0 - 32 IU/L 34       Latest Ref Rng & Units 10/11/2021   12:32 PM  CBC  WBC 3.4 - 10.8 x10E3/uL 8.6   Hemoglobin 11.1 - 15.9 g/dL 16.6   Hematocrit 34.0 - 46.6 % 47.8   Platelets 150 - 450 x10E3/uL 227     Lipid Panel Recent Labs    10/11/21 1232  CHOL 182  TRIG 78  LDLCALC 114*  HDL 54   Labs 10/11/2021: Apolipoprotein A-1 116 - 209 mg/dL  164  Apolipoprotein B <90 mg/dL   94 [place]   Apolipo. B/A-1 Ratio 0.0 - 0.6 ratio  0.6 Normal (0.00 to 0.6)   HEMOGLOBIN A1C Lab Results  Component Value Date   HGBA1C 5.5 10/11/2021   TSH Recent Labs    10/11/21 1232  TSH 1.410   External labs:   Cholesterol, total 143.000 m 06/23/2022 HDL 50.000 mg 06/23/2022 LDL 74.000 mg 06/23/2022 Triglycerides 103.000 m 06/23/2022  A1C 5.500 % 10/11/2021 TSH 2.800  06/23/2022  Hemoglobin 17.300 g/d 06/23/2022 Platelets 219.000 Th 06/23/2022  Creatinine, Serum 0.810  mg/ 06/23/2022 Potassium 4.400 mm 06/23/2022 ALT (SGPT) 51.000 U/L 06/23/2022  Radiology:   Cardiac Studies:   Coronary calcium score 11/06/2021: LM 0 LAD 60 LCx 4 RCA 29 Total Agatston score 93, [facility] database percentile 99. Ascending and descending thoracic aortic measurements are normal.  No significant extracardiac abnormality.  PCV ECHOCARDIOGRAM COMPLETE 10/23/2021  Normal LV systolic function with visual EF 60-65%. Left ventricle cavity is normal in size. Normal global wall motion. Normal diastolic filling pattern, normal LAP. Trace aortic regurgitation. No prior study for comparison.    EKG:   EKG 09/03/2022: Normal sinus rhythm 90 bpm, normal EKG.  Compared to 10/11/2021, heart rate was previously 109 bpm.  Otherwise no change.   Assessment     ICD-10-CM   1. Elevated coronary artery calcium score Agatston score 93, 99th percentile [facility] data base 11/06/2021  R93.1 EKG 12-Lead    2. Primary hypertension  I10 losartan-hydrochlorothiazide (HYZAAR) 100-12.5 MG tablet    3. Pure hypercholesterolemia  E78.00     4. Tobacco use disorder, continuous  F17.209     5. 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    Dispense:  90 tablet    Refill:  3    Discontinue Los H 50/12.5 mg Rx   Orders Placed This Encounter  Procedures   EKG 12-Lead   Recommendations:   Rita Cunningham is a 52 y.o. Caucasian female patient with strong family history of vascular disease, mother had strokes at age 60, father had coronary disease and CABG at age 24, her brother 23 years older than her has had coronary stents at age 53, hypertension, hyperlipidemia,  tobacco use disorder, moderate obesity, history of left upper extremity DVT and PE while she was on birth control pills in the past, presents to me for follow-up of hypertension, dyspnea on exertion, elevated heart rate.    1. Elevated coronary artery calcium score Agatston score 93, 99th percentile [facility] data base 11/06/2021 I again reviewed the cells of the elevated coronary calcium score, reiterated the importance of primary prevention.  She has reduced smoking.  She is presently on statins, with a weight loss of 5 to 10 pounds and smoking cessation, expect HDL to improve and also LDL to come down further to <70.  2. Primary hypertension Blood pressure is very well-controlled.  I will further increase losartan HCT both for cardiovascular protection from 50/12.5 mg to 100/12.5 mg in the morning and hopefully this will bring down diastolic blood pressure by additional 3-4 points.  Since being on verapamil heart rate has improved to <100 bpm.  3. Pure hypercholesterolemia LDL is just above goal of <70.  She is tolerating statins with combination of simvastatin and Zetia without any side effects.  Continue the same.  4. Tobacco use disorder, continuous Smoking cessation again stressed to the patient.  5. Polycythemia secondary to smoking I am concerned about polycythemia, with her vascular risk factors, she is at extreme Rew risk for both stroke and for vascular disease including but not limited to coronary artery disease and peripheral  artery disease.  Advised for now, she is on appropriate medical therapy, I will see her back on a as needed basis.    Adrian Prows, MD, [facility] 09/03/2022, 2:32 PM Office: [phone]

## 2023-01-08 ENCOUNTER — Other Ambulatory Visit: Payer: Self-pay | Admitting: Cardiology

## 2023-01-08 DIAGNOSIS — I10 Essential (primary) hypertension: Secondary | ICD-10-CM

## 2023-07-03 ENCOUNTER — Other Ambulatory Visit: Payer: Self-pay | Admitting: Cardiology

## 2023-07-03 DIAGNOSIS — E7800 Pure hypercholesterolemia, unspecified: Secondary | ICD-10-CM

## 2023-07-03 DIAGNOSIS — R931 Abnormal findings on diagnostic imaging of heart and coronary circulation: Secondary | ICD-10-CM

## 2024-01-25 IMAGING — CT CT CARDIAC CORONARY ARTERY CALCIUM SCORE
3 series · 14 of 20 positions shown, 16 images · non-contrast
Comparison: 09/08/2020

CLINICAL DATA: CAD screening, low CAD risk.  Hyperlipidemia.

EXAM:
CT CARDIAC CORONARY ARTERY CALCIUM SCORE
TECHNIQUE: Non-contrast imaging through the heart was performed using
prospective ECG gating. Image post processing was performed on an
independent workstation, allowing for quantitative analysis of the
heart and coronary arteries. Note that this exam targets the heart
and the chest was not imaged in its entirety.

[Series 2: calcium scoring 2.00 qr36 bestdiast 70% hrt calciu · axial · 0.48mm/px · z∈[+1565,+1649]mm · 4 of 70 slices shown]
[im 14/70  vessel]
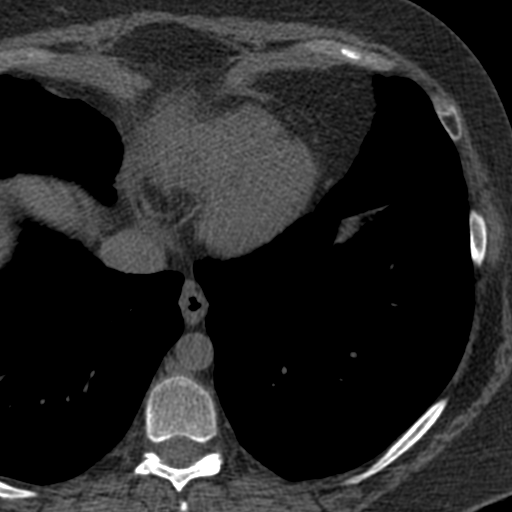
[im 28/70  vessel]
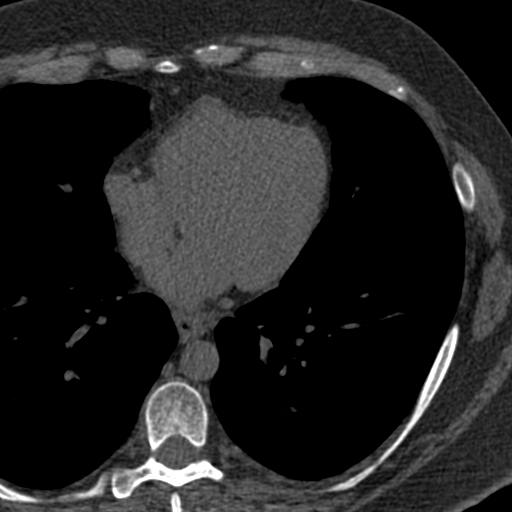
[im 42/70  vessel]
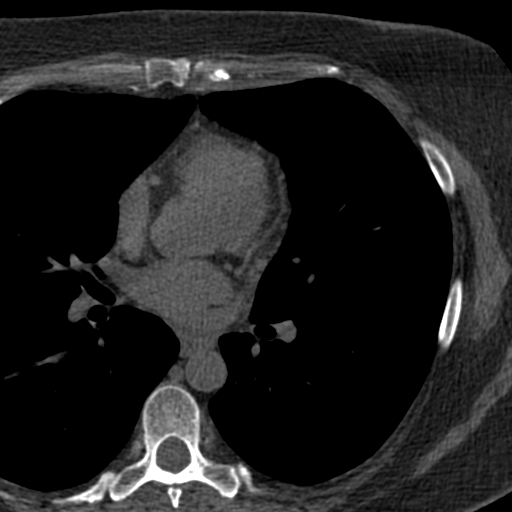
[im 56/70  vessel]
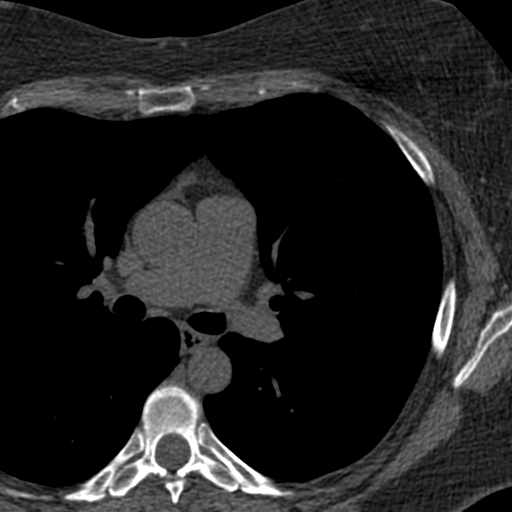

[Series 3: calcium scoring 2.00 br40 bestdiast 70% axial · axial · 0.64mm/px · z∈[+1561,+1653]mm · 5 of 70 slices shown, 7 images]
[im 12/70  vessel]
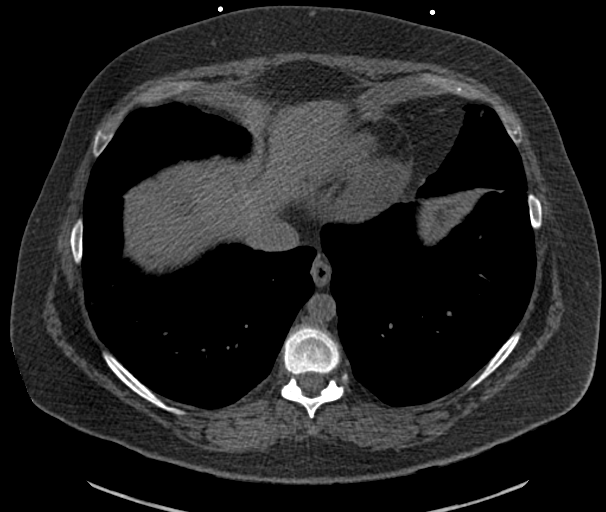
[im 12/70  lung]
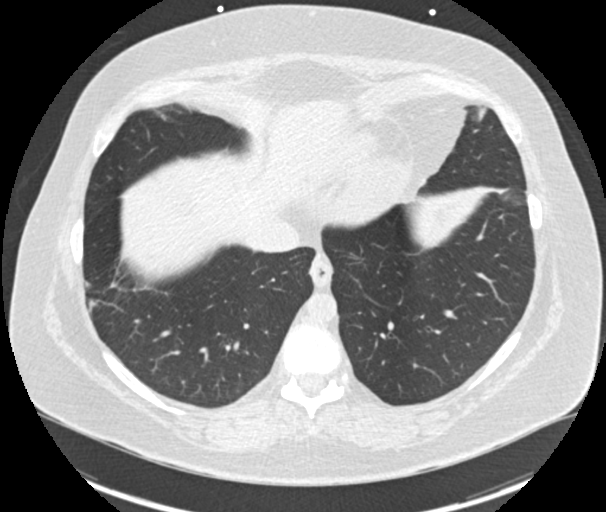
[im 24/70  vessel]
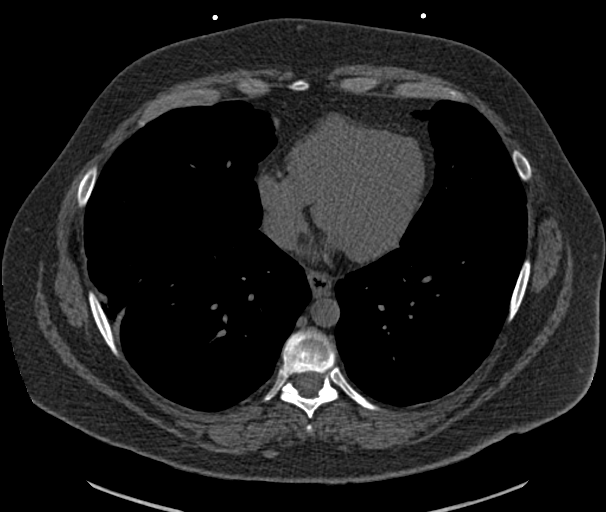
[im 35/70  vessel]
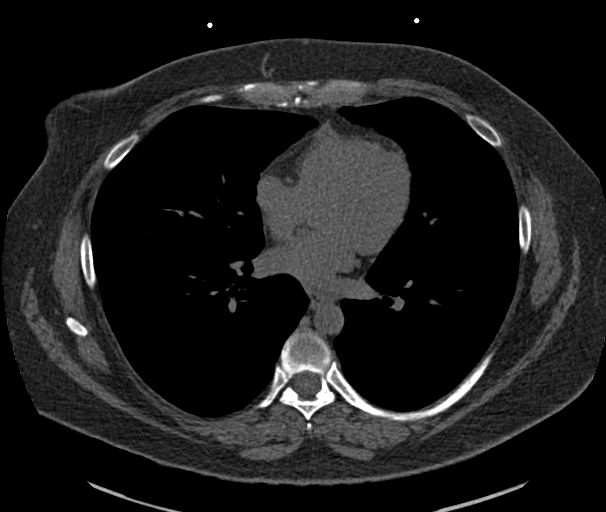
[im 47/70  vessel]
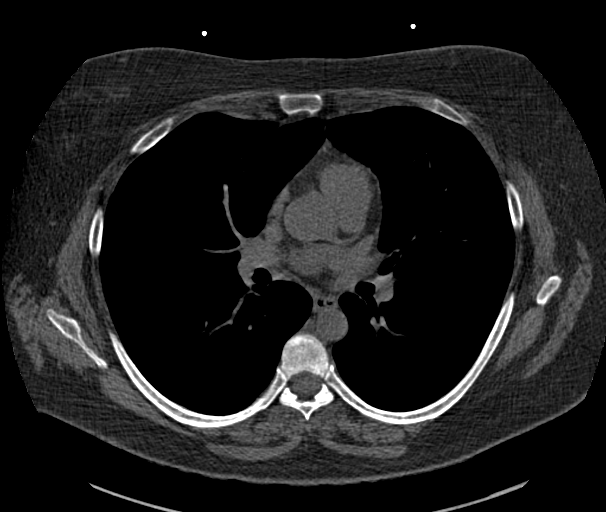
[im 58/70  vessel]
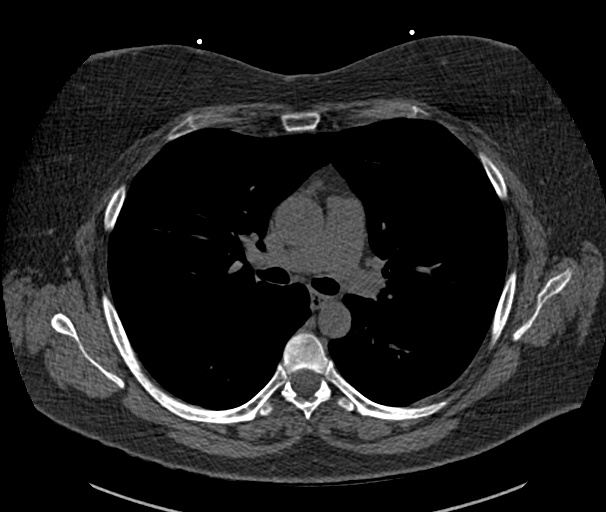
[im 58/70  lung]
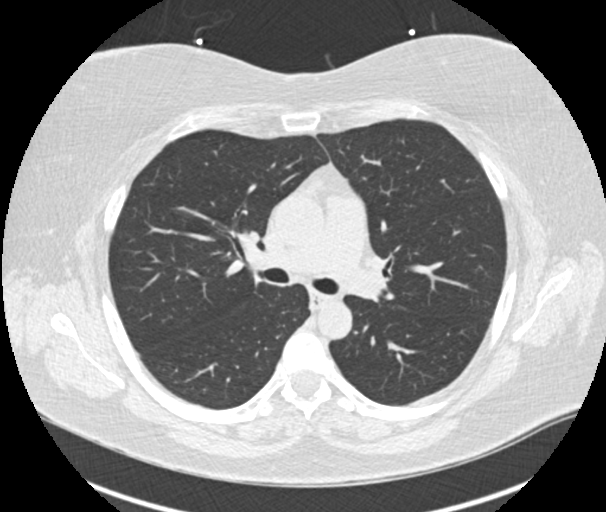

[Series 9: calcium scoring 2.00 br60 bestdiast 70% lungs · axial · 0.64mm/px · z∈[+1561,+1653]mm · 5 of 70 slices shown]
[im 12/70  vessel]
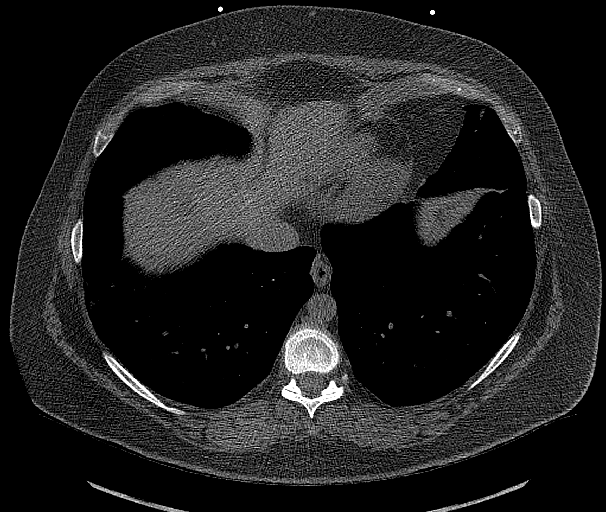
[im 24/70  vessel]
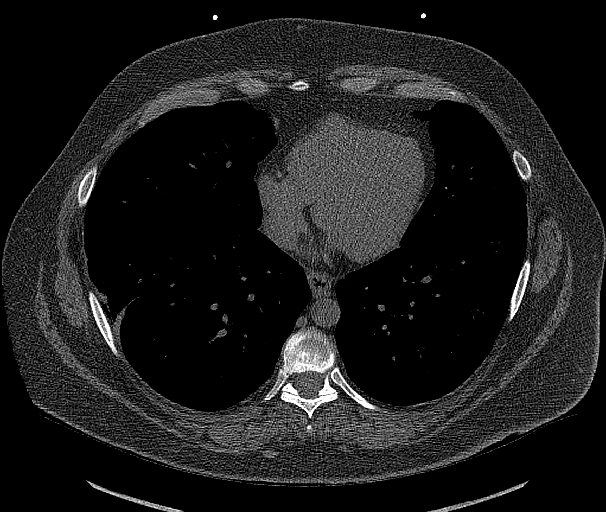
[im 35/70  vessel]
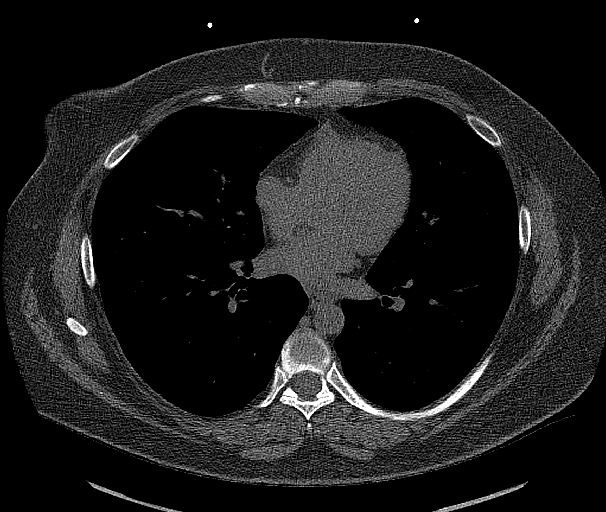
[im 47/70  vessel]
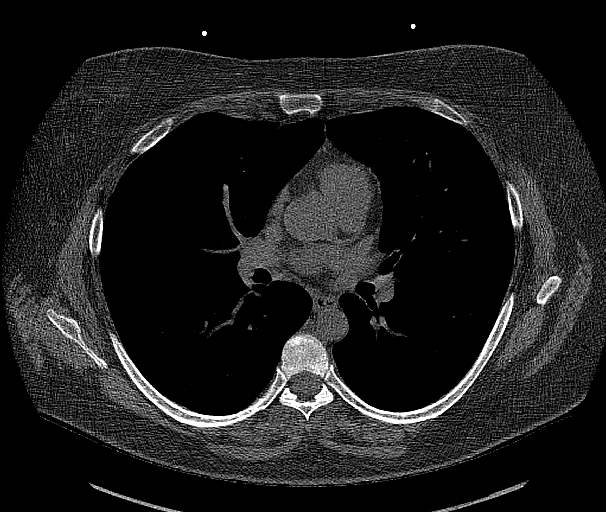
[im 58/70  vessel]
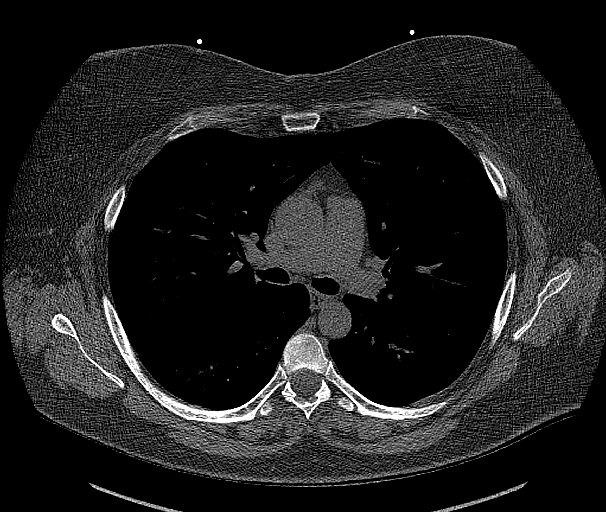

[14 of 20 positions shown; findings below may reference images not displayed]

FINDINGS: CORONARY CALCIUM SCORES:

Left Main: 0

LAD: 60

LCx: 4

RCA: 29

Total Agatston Score: 93

[HOSPITAL] percentile: 99

AORTA MEASUREMENTS:

Ascending Aorta: 30 mm

Descending Aorta: 19 mm

OTHER FINDINGS:

Heart is normal size. Aorta normal caliber. No adenopathy. Linear
scarring in the right lower lobe. No confluent airspace opacities or
effusions. No acute findings in the upper abdomen. Chest wall soft
tissues are unremarkable. No acute bony abnormality.
IMPRESSION: Total Agatston score: 93

[HOSPITAL] percentile: 99

No acute or significant extracardiac abnormality.

## 2024-04-05 ENCOUNTER — Other Ambulatory Visit: Payer: Self-pay | Admitting: Cardiology

## 2024-04-05 DIAGNOSIS — I10 Essential (primary) hypertension: Secondary | ICD-10-CM

## 2024-04-06 ENCOUNTER — Other Ambulatory Visit: Payer: Self-pay | Admitting: Medical Genetics

## 2024-06-21 ENCOUNTER — Other Ambulatory Visit: Payer: Self-pay | Admitting: Medical Genetics

## 2024-06-21 DIAGNOSIS — Z006 Encounter for examination for normal comparison and control in clinical research program: Secondary | ICD-10-CM

## 2024-07-07 ENCOUNTER — Other Ambulatory Visit: Payer: Self-pay | Admitting: Cardiology

## 2024-07-07 DIAGNOSIS — I10 Essential (primary) hypertension: Secondary | ICD-10-CM

## 2024-08-12 ENCOUNTER — Other Ambulatory Visit: Payer: Self-pay

## 2024-08-12 DIAGNOSIS — E7800 Pure hypercholesterolemia, unspecified: Secondary | ICD-10-CM

## 2024-08-12 DIAGNOSIS — R931 Abnormal findings on diagnostic imaging of heart and coronary circulation: Secondary | ICD-10-CM

## 2024-08-15 MED ORDER — EZETIMIBE-SIMVASTATIN 10-40 MG PO TABS: 1.0000 | ORAL_TABLET | Freq: Every day | ORAL | 0 refills | Status: AC

## 2024-08-24 ENCOUNTER — Other Ambulatory Visit: Payer: Self-pay | Admitting: Cardiology

## 2024-08-24 DIAGNOSIS — I10 Essential (primary) hypertension: Secondary | ICD-10-CM

## 2024-08-24 DIAGNOSIS — R931 Abnormal findings on diagnostic imaging of heart and coronary circulation: Secondary | ICD-10-CM

## 2024-08-24 DIAGNOSIS — E7800 Pure hypercholesterolemia, unspecified: Secondary | ICD-10-CM

## 2024-08-31 ENCOUNTER — Other Ambulatory Visit: Payer: Self-pay | Admitting: Cardiology

## 2024-08-31 DIAGNOSIS — E7800 Pure hypercholesterolemia, unspecified: Secondary | ICD-10-CM

## 2024-08-31 DIAGNOSIS — R931 Abnormal findings on diagnostic imaging of heart and coronary circulation: Secondary | ICD-10-CM
